# Patient Record
Sex: Male | Born: 1945 | Race: White | Hispanic: No | Marital: Single | Smoking: Current every day smoker
Health system: Southern US, Community
[De-identification: ages and names within clinical notes are randomized; demographics above are authoritative.]

## PROBLEM LIST (undated history)

## (undated) DIAGNOSIS — I10 Essential (primary) hypertension: Secondary | ICD-10-CM

## (undated) DIAGNOSIS — Z972 Presence of dental prosthetic device (complete) (partial): Secondary | ICD-10-CM

## (undated) DIAGNOSIS — G459 Transient cerebral ischemic attack, unspecified: Secondary | ICD-10-CM

## (undated) DIAGNOSIS — T753XXA Motion sickness, initial encounter: Secondary | ICD-10-CM

## (undated) DIAGNOSIS — F0390 Unspecified dementia without behavioral disturbance: Secondary | ICD-10-CM

## (undated) DIAGNOSIS — M1990 Unspecified osteoarthritis, unspecified site: Secondary | ICD-10-CM

## (undated) DIAGNOSIS — IMO0001 Reserved for inherently not codable concepts without codable children: Secondary | ICD-10-CM

## (undated) DIAGNOSIS — I639 Cerebral infarction, unspecified: Secondary | ICD-10-CM

## (undated) DIAGNOSIS — R29898 Other symptoms and signs involving the musculoskeletal system: Secondary | ICD-10-CM

## (undated) DIAGNOSIS — R569 Unspecified convulsions: Secondary | ICD-10-CM

## (undated) HISTORY — PX: FOOT SURGERY: SHX648

## (undated) HISTORY — PX: TONSILLECTOMY: SUR1361

---

## 2010-05-27 ENCOUNTER — Emergency Department: Payer: Self-pay | Admitting: Internal Medicine

## 2010-10-31 ENCOUNTER — Observation Stay: Payer: Self-pay | Admitting: *Deleted

## 2013-09-12 ENCOUNTER — Ambulatory Visit: Payer: Self-pay

## 2014-11-12 DIAGNOSIS — E7800 Pure hypercholesterolemia, unspecified: Secondary | ICD-10-CM | POA: Insufficient documentation

## 2014-11-12 DIAGNOSIS — G40909 Epilepsy, unspecified, not intractable, without status epilepticus: Secondary | ICD-10-CM | POA: Insufficient documentation

## 2014-12-29 ENCOUNTER — Ambulatory Visit: Payer: Self-pay | Admitting: Family Medicine

## 2015-12-08 ENCOUNTER — Emergency Department: Payer: Medicare Other

## 2015-12-08 ENCOUNTER — Encounter: Payer: Self-pay | Admitting: Emergency Medicine

## 2015-12-08 ENCOUNTER — Inpatient Hospital Stay
Admission: EM | Admit: 2015-12-08 | Discharge: 2015-12-12 | DRG: 389 | Disposition: A | Discharge status: Skilled Nursing Facility | Payer: Medicare Other | Attending: Surgery | Admitting: Surgery

## 2015-12-08 DIAGNOSIS — N179 Acute kidney failure, unspecified: Secondary | ICD-10-CM | POA: Diagnosis present

## 2015-12-08 DIAGNOSIS — R1310 Dysphagia, unspecified: Secondary | ICD-10-CM | POA: Diagnosis present

## 2015-12-08 DIAGNOSIS — Z87891 Personal history of nicotine dependence: Secondary | ICD-10-CM | POA: Diagnosis not present

## 2015-12-08 DIAGNOSIS — E871 Hypo-osmolality and hyponatremia: Secondary | ICD-10-CM | POA: Diagnosis present

## 2015-12-08 DIAGNOSIS — Z7902 Long term (current) use of antithrombotics/antiplatelets: Secondary | ICD-10-CM

## 2015-12-08 DIAGNOSIS — C9000 Multiple myeloma not having achieved remission: Secondary | ICD-10-CM

## 2015-12-08 DIAGNOSIS — R112 Nausea with vomiting, unspecified: Secondary | ICD-10-CM | POA: Diagnosis present

## 2015-12-08 DIAGNOSIS — E861 Hypovolemia: Secondary | ICD-10-CM | POA: Diagnosis present

## 2015-12-08 DIAGNOSIS — K5669 Other intestinal obstruction: Secondary | ICD-10-CM | POA: Diagnosis not present

## 2015-12-08 DIAGNOSIS — I69391 Dysphagia following cerebral infarction: Secondary | ICD-10-CM

## 2015-12-08 DIAGNOSIS — R0902 Hypoxemia: Secondary | ICD-10-CM

## 2015-12-08 DIAGNOSIS — K209 Esophagitis, unspecified: Secondary | ICD-10-CM | POA: Diagnosis present

## 2015-12-08 DIAGNOSIS — E876 Hypokalemia: Secondary | ICD-10-CM | POA: Diagnosis present

## 2015-12-08 DIAGNOSIS — M810 Age-related osteoporosis without current pathological fracture: Secondary | ICD-10-CM | POA: Diagnosis present

## 2015-12-08 DIAGNOSIS — I10 Essential (primary) hypertension: Secondary | ICD-10-CM | POA: Diagnosis not present

## 2015-12-08 DIAGNOSIS — R591 Generalized enlarged lymph nodes: Secondary | ICD-10-CM | POA: Diagnosis present

## 2015-12-08 DIAGNOSIS — Z79899 Other long term (current) drug therapy: Secondary | ICD-10-CM

## 2015-12-08 DIAGNOSIS — D3500 Benign neoplasm of unspecified adrenal gland: Secondary | ICD-10-CM | POA: Diagnosis present

## 2015-12-08 DIAGNOSIS — R05 Cough: Secondary | ICD-10-CM | POA: Diagnosis present

## 2015-12-08 DIAGNOSIS — E860 Dehydration: Secondary | ICD-10-CM | POA: Diagnosis present

## 2015-12-08 DIAGNOSIS — N390 Urinary tract infection, site not specified: Secondary | ICD-10-CM | POA: Diagnosis present

## 2015-12-08 DIAGNOSIS — I69354 Hemiplegia and hemiparesis following cerebral infarction affecting left non-dominant side: Secondary | ICD-10-CM | POA: Diagnosis not present

## 2015-12-08 DIAGNOSIS — Z79891 Long term (current) use of opiate analgesic: Secondary | ICD-10-CM

## 2015-12-08 DIAGNOSIS — K56609 Unspecified intestinal obstruction, unspecified as to partial versus complete obstruction: Secondary | ICD-10-CM

## 2015-12-08 DIAGNOSIS — F17210 Nicotine dependence, cigarettes, uncomplicated: Secondary | ICD-10-CM | POA: Diagnosis present

## 2015-12-08 DIAGNOSIS — G40909 Epilepsy, unspecified, not intractable, without status epilepticus: Secondary | ICD-10-CM | POA: Diagnosis present

## 2015-12-08 DIAGNOSIS — R319 Hematuria, unspecified: Secondary | ICD-10-CM | POA: Diagnosis present

## 2015-12-08 DIAGNOSIS — K5660 Unspecified intestinal obstruction: Secondary | ICD-10-CM | POA: Diagnosis present

## 2015-12-08 DIAGNOSIS — Z23 Encounter for immunization: Secondary | ICD-10-CM

## 2015-12-08 DIAGNOSIS — M899 Disorder of bone, unspecified: Secondary | ICD-10-CM | POA: Diagnosis not present

## 2015-12-08 DIAGNOSIS — I700 Atherosclerosis of aorta: Secondary | ICD-10-CM | POA: Diagnosis present

## 2015-12-08 HISTORY — DX: Transient cerebral ischemic attack, unspecified: G45.9

## 2015-12-08 HISTORY — DX: Cerebral infarction, unspecified: I63.9

## 2015-12-08 HISTORY — DX: Unspecified convulsions: R56.9

## 2015-12-08 HISTORY — DX: Essential (primary) hypertension: I10

## 2015-12-08 LAB — COMPREHENSIVE METABOLIC PANEL
ALK PHOS: 130 U/L — AB (ref 38–126)
ALT: 13 U/L — ABNORMAL LOW (ref 17–63)
ANION GAP: 8 (ref 5–15)
AST: 21 U/L (ref 15–41)
Albumin: 3.4 g/dL — ABNORMAL LOW (ref 3.5–5.0)
BUN: 20 mg/dL (ref 6–20)
CHLORIDE: 90 mmol/L — AB (ref 101–111)
CO2: 31 mmol/L (ref 22–32)
Calcium: 8.5 mg/dL — ABNORMAL LOW (ref 8.9–10.3)
Creatinine, Ser: 1.41 mg/dL — ABNORMAL HIGH (ref 0.61–1.24)
GFR calc Af Amer: 56 mL/min — ABNORMAL LOW (ref >60)
GFR calc non Af Amer: 48 mL/min — ABNORMAL LOW (ref >60)
Glucose, Bld: 130 mg/dL — ABNORMAL HIGH (ref 65–99)
Potassium: 3.8 mmol/L (ref 3.5–5.1)
SODIUM: 129 mmol/L — AB (ref 135–145)
Total Bilirubin: 0.5 mg/dL (ref 0.3–1.2)
Total Protein: 6.5 g/dL (ref 6.5–8.1)

## 2015-12-08 LAB — CBC WITH DIFFERENTIAL/PLATELET
BASOS PCT: 0 %
Basophils Absolute: 0 10*3/uL (ref 0–0.1)
EOS PCT: 0 %
Eosinophils Absolute: 0 10*3/uL (ref 0–0.7)
HCT: 38.7 % — ABNORMAL LOW (ref 40.0–52.0)
HEMOGLOBIN: 13.1 g/dL (ref 13.0–18.0)
Lymphocytes Relative: 2 %
Lymphs Abs: 0.3 10*3/uL — ABNORMAL LOW (ref 1.0–3.6)
MCH: 32.4 pg (ref 26.0–34.0)
MCHC: 34 g/dL (ref 32.0–36.0)
MCV: 95.3 fL (ref 80.0–100.0)
MONO ABS: 0.8 10*3/uL (ref 0.2–1.0)
MONOS PCT: 6 %
Neutro Abs: 11.9 10*3/uL — ABNORMAL HIGH (ref 1.4–6.5)
Neutrophils Relative %: 92 %
PLATELETS: 326 10*3/uL (ref 150–440)
RBC: 4.06 MIL/uL — ABNORMAL LOW (ref 4.40–5.90)
RDW: 14.7 % — ABNORMAL HIGH (ref 11.5–14.5)
WBC: 13 10*3/uL — ABNORMAL HIGH (ref 3.8–10.6)

## 2015-12-08 LAB — LIPASE, BLOOD: Lipase: 16 U/L (ref 11–51)

## 2015-12-08 MED ORDER — IOHEXOL 240 MG/ML SOLN
25.0000 mL | Freq: Once | INTRAMUSCULAR | Status: AC | PRN
  Administered 2015-12-08: 25 mL via ORAL

## 2015-12-08 MED ORDER — CARBAMAZEPINE 200 MG PO TABS
400.0000 mg | ORAL_TABLET | Freq: Two times a day (BID) | ORAL | Status: DC
  Administered 2015-12-10 – 2015-12-12 (×5): 400 mg via ORAL
  Filled 2015-12-08 (×8): qty 2

## 2015-12-08 MED ORDER — ONDANSETRON HCL 4 MG/2ML IJ SOLN
4.0000 mg | Freq: Four times a day (QID) | INTRAMUSCULAR | Status: DC | PRN
Start: 2015-12-08 — End: 2015-12-12
  Administered 2015-12-08: 4 mg via INTRAVENOUS
  Filled 2015-12-08: qty 2

## 2015-12-08 MED ORDER — ONDANSETRON HCL 4 MG/2ML IJ SOLN
4.0000 mg | Freq: Once | INTRAMUSCULAR | Status: AC | PRN
  Administered 2015-12-08: 4 mg via INTRAVENOUS

## 2015-12-08 MED ORDER — NICOTINE 14 MG/24HR TD PT24
MEDICATED_PATCH | TRANSDERMAL | Status: AC
  Administered 2015-12-08: 14 mg via TRANSDERMAL
  Filled 2015-12-08: qty 1

## 2015-12-08 MED ORDER — ONDANSETRON HCL 4 MG/2ML IJ SOLN
4.0000 mg | Freq: Once | INTRAMUSCULAR | Status: AC
  Administered 2015-12-08: 4 mg via INTRAVENOUS
  Filled 2015-12-08: qty 2

## 2015-12-08 MED ORDER — IOHEXOL 300 MG/ML  SOLN
80.0000 mL | Freq: Once | INTRAMUSCULAR | Status: AC | PRN
  Administered 2015-12-08: 80 mL via INTRAVENOUS

## 2015-12-08 MED ORDER — NICOTINE 14 MG/24HR TD PT24
14.0000 mg | MEDICATED_PATCH | Freq: Every day | TRANSDERMAL | Status: DC
  Administered 2015-12-08 – 2015-12-12 (×5): 14 mg via TRANSDERMAL
  Filled 2015-12-08 (×4): qty 1

## 2015-12-08 MED ORDER — PHENYTOIN 50 MG PO CHEW
50.0000 mg | CHEWABLE_TABLET | Freq: Two times a day (BID) | ORAL | Status: DC
  Filled 2015-12-08: qty 1

## 2015-12-08 MED ORDER — ONDANSETRON HCL 4 MG/2ML IJ SOLN
INTRAMUSCULAR | Status: AC
  Administered 2015-12-08: 4 mg via INTRAVENOUS
  Filled 2015-12-08: qty 2

## 2015-12-08 MED ORDER — PHENYTOIN SODIUM EXTENDED 100 MG PO CAPS: 200.0000 mg | ORAL_CAPSULE | Freq: Every day | ORAL | Status: DC

## 2015-12-08 MED ORDER — SODIUM CHLORIDE 0.9 % IV BOLUS (SEPSIS)
1000.0000 mL | Freq: Once | INTRAVENOUS | Status: AC
  Administered 2015-12-08: 1000 mL via INTRAVENOUS

## 2015-12-08 MED ORDER — PANTOPRAZOLE SODIUM 40 MG IV SOLR: 40.0000 mg | Freq: Every day | INTRAVENOUS | Status: DC

## 2015-12-08 MED ORDER — SIMVASTATIN 40 MG PO TABS
40.0000 mg | ORAL_TABLET | Freq: Every day | ORAL | Status: DC
  Administered 2015-12-10 – 2015-12-11 (×2): 40 mg via ORAL
  Filled 2015-12-08 (×2): qty 1

## 2015-12-08 MED ORDER — DEXTROSE IN LACTATED RINGERS 5 % IV SOLN
INTRAVENOUS | Status: DC
  Administered 2015-12-08 – 2015-12-10 (×5): via INTRAVENOUS
  Filled 2015-12-08 (×3): qty 1000

## 2015-12-08 MED ORDER — ONDANSETRON 4 MG PO TBDP: 4.0000 mg | ORAL_TABLET | Freq: Four times a day (QID) | ORAL | Status: DC | PRN

## 2015-12-08 MED ORDER — PHENYTOIN SODIUM 50 MG/ML IJ SOLN
100.0000 mg | Freq: Four times a day (QID) | INTRAMUSCULAR | Status: DC
  Administered 2015-12-08 – 2015-12-10 (×7): 100 mg via INTRAVENOUS
  Filled 2015-12-08 (×13): qty 2

## 2015-12-08 MED ORDER — PANTOPRAZOLE SODIUM 40 MG IV SOLR
40.0000 mg | Freq: Two times a day (BID) | INTRAVENOUS | Status: DC
  Administered 2015-12-08 – 2015-12-12 (×8): 40 mg via INTRAVENOUS
  Filled 2015-12-08 (×8): qty 40

## 2015-12-08 MED ORDER — MORPHINE SULFATE (PF) 2 MG/ML IV SOLN
2.0000 mg | INTRAVENOUS | Status: DC | PRN
  Administered 2015-12-08 – 2015-12-09 (×3): 2 mg via INTRAVENOUS
  Filled 2015-12-08 (×3): qty 1

## 2015-12-08 MED ORDER — HEPARIN SODIUM (PORCINE) 5000 UNIT/ML IJ SOLN
5000.0000 [IU] | Freq: Three times a day (TID) | INTRAMUSCULAR | Status: DC
  Administered 2015-12-08 – 2015-12-12 (×10): 5000 [IU] via SUBCUTANEOUS
  Filled 2015-12-08 (×10): qty 1

## 2015-12-08 NOTE — ED Notes (Signed)
Pt and patient's daughter state that patient started having nausea with vomiting that started last night around 10pm.  Daughter states that patient's vomit was black/tarry and his stool has been [employer].  Pt also has been "dragging" his left leg and complains of weakness, per daughter.  Pt has history of stroke x2, and is also complaining of headache.  Of note, patient has a cough with coarse rhonchi/gurgles, but daughter states she has not seen the actual sputum.

## 2015-12-08 NOTE — Consult Note (Signed)
[facility] [place] NAME: Elijah Evans    MR#:  [id number]  DATE OF BIRTH:  [birth year]  DATE OF ADMISSION:  12/08/2015  PRIMARY CARE PHYSICIAN: Nonlocal  REQUESTING/REFERRING PHYSICIAN: Dr. Susa Griffins  CHIEF COMPLAINT:   Chief Complaint  Patient presents with  . Emesis  . Weakness  . Headache    HISTORY OF PRESENT ILLNESS:  Elijah Evans  is a 70 y.o. male with a known history of stroke 5 years ago with minimal left-sided weakness, hypertension, seizure disorder presents to the hospital from home secondary to worsening nausea and vomiting and abdominal distention that started last night. Patient is a poor historian, his daughter who accompanied him is currently not at bedside. According to the patient he was in his usual state of health and all of a sudden last night he had to wake up because of significant nausea and then he started throwing up. Denies any blood in the vomitus. But initially it was food and then started to become more dark colored vomitus. Denies any diarrhea. Had a normal bowel movement last night. No blood per rectum noted. He feels like his abdomen is swollen and has generalized abdominal pain. No prior abdominal surgeries noted. CT of the abdomen here shows complete small bowel obstruction. There are also some nonspecific lymph nodes noted in the abdomen and osteoporosis and bony changes that could represent maybe myeloma. Medicine consult requested for medical management.  PAST MEDICAL HISTORY:   Past Medical History  Diagnosis Date  . Stroke ([place])   . TIA (transient ischemic attack)   . Hypertension   . Seizures ([place])     PAST SURGICAL HISTOIRY:   Past Surgical History  Procedure Laterality Date  . Foot surgery    . Tonsillectomy      SOCIAL HISTORY:   Social History  Substance Use Topics  . Smoking status: Current Every Day Smoker -- 1.00 packs/day    Types: Cigarettes  . Smokeless  tobacco: Not on file  . Alcohol Use: No     Comment: Used to drink a lot but quit 5 years ago.    FAMILY HISTORY:   Family History  Problem Relation Age of Onset  . CAD Mother   . CAD Father     DRUG ALLERGIES:  No Known Allergies  REVIEW OF SYSTEMS:   Review of Systems  Constitutional: Negative for fever, chills, weight loss and malaise/fatigue.  HENT: Negative for ear discharge, ear pain, hearing loss and nosebleeds.   Eyes: Negative for blurred vision, double vision and photophobia.  Respiratory: Negative for cough, hemoptysis, shortness of breath and wheezing.   Cardiovascular: Negative for chest pain, palpitations, orthopnea and leg swelling.  Gastrointestinal: Positive for nausea, vomiting and abdominal pain. Negative for heartburn, diarrhea, constipation and melena.  Genitourinary: Negative for dysuria, urgency, frequency and hematuria.  Musculoskeletal: Negative for myalgias, back pain and neck pain.  Skin: Negative for rash.  Neurological: Negative for dizziness, tremors, sensory change, speech change, focal weakness and headaches.  Endo/Heme/Allergies: Does not bruise/bleed easily.  Psychiatric/Behavioral: Negative for depression.    MEDICATIONS AT HOME:   Prior to Admission medications   Medication Sig Start Date End Date Taking? Authorizing Provider  carbamazepine (TEGRETOL) 200 MG tablet Take 400 mg by mouth 2 (two) times daily.   Yes Historical Provider, MD  dipyridamole-aspirin (AGGRENOX) 200-25 MG 12hr capsule Take 1 capsule by mouth 2 (two) times daily.   Yes Historical Provider, MD  losartan-hydrochlorothiazide (HYZAAR) 100-25 MG tablet Take 1 tablet by mouth daily.   Yes Historical Provider, MD  phenytoin (DILANTIN) 100 MG ER capsule Take 200 mg by mouth at bedtime.   Yes Historical Provider, MD  phenytoin (DILANTIN) 30 MG ER capsule Take 60 mg by mouth 2 (two) times daily.   Yes Historical Provider, MD  simvastatin (ZOCOR) 40 MG tablet Take 40 mg by mouth  at bedtime.   Yes Historical Provider, MD      VITAL SIGNS:  Blood pressure 119/71, pulse 99, temperature 98.7 F (37.1 C), temperature source Oral, resp. rate 16, height 6' (1.829 m), weight 83.915 kg (185 lb), SpO2 96 %.  PHYSICAL EXAMINATION:   Physical Exam  GENERAL:  70 y.o.-year-old patient lying in the bed with no acute distress.  EYES: Pupils are unequal, right pupil is slightly enlarged than the left one but both are round, reactive to light and accommodation. No scleral icterus. Extraocular muscles intact.  HEENT: Head atraumatic, normocephalic. Oropharynx and nasopharynx clear. Mild left facial droop is noted. NECK:  Supple, no jugular venous distention. No thyroid enlargement, no tenderness.  LUNGS: Normal breath sounds bilaterally, no wheezing, rales,rhonchi or crepitation. No use of accessory muscles of respiration. Decreased bibasilar breath sounds. Some gurgling congestion noted mostly in the upper airway. CARDIOVASCULAR: S1, S2 normal. No  rubs, or gallops. 3/6 systolic murmur is present ABDOMEN: Soft, nontender, nondistended. Bowel sounds present. No organomegaly or mass.  EXTREMITIES: No pedal edema, cyanosis, or clubbing.  NEUROLOGIC: Cranial nerves II through XII are intact. Muscle strength 5/5 in all extremities. Sensation intact. Gait not checked. Mild left facial droop noted. PSYCHIATRIC: The patient is alert and oriented x 3.  SKIN: No obvious rash, lesion, or ulcer.   LABORATORY PANEL:   CBC  Recent Labs Lab 12/08/15 1311  WBC 13.0*  HGB 13.1  HCT 38.7*  PLT 326   ------------------------------------------------------------------------------------------------------------------  Chemistries   Recent Labs Lab 12/08/15 1311  NA 129*  K 3.8  CL 90*  CO2 31  GLUCOSE 130*  BUN 20  CREATININE 1.41*  CALCIUM 8.5*  AST 21  ALT 13*  ALKPHOS 130*  BILITOT 0.5    ------------------------------------------------------------------------------------------------------------------  Cardiac Enzymes No results for input(s): TROPONINI in the last 168 hours. ------------------------------------------------------------------------------------------------------------------  RADIOLOGY:  Dg Chest 2 View  12/08/2015  CLINICAL DATA:  Nausea, vomiting beginning last night around 10 p.m. Cough. EXAM: CHEST  2 VIEW COMPARISON:  09/12/2013 FINDINGS: Calcified granulomas in the right mid and lower lung. No confluent airspace opacities or effusions. No acute bony abnormality. Heart is normal size. No effusions. IMPRESSION: No active cardiopulmonary disease.  Old granulomatous disease. Electronically Signed   By: Rolm Baptise M.D.   On: 12/08/2015 13:34   Ct Head Wo Contrast  12/08/2015  CLINICAL DATA:  Nausea and vomiting starting last night. EXAM: CT HEAD WITHOUT CONTRAST TECHNIQUE: Contiguous axial images were obtained from the base of the skull through the vertex without contrast. COMPARISON:  10/31/2010 FINDINGS: There is mild cerebral atrophy. Mild irregularity along the top of the lateral ventricles is similar to the prior examination. Again noted are scattered areas of low-density throughout the white matter suggesting chronic changes. No evidence for acute hemorrhage, mass lesion, midline shift, hydrocephalus or large infarct. Visualized paranasal sinuses are clear. No acute bone abnormality. IMPRESSION: No acute intracranial abnormality. Chronic scattered white matter changes. Findings are similar to the previous examination. Electronically Signed   By: Markus Daft M.D.   On: 12/08/2015 17:40  Ct Abdomen Pelvis W Contrast  12/08/2015  CLINICAL DATA:  Nausea and vomiting since last evening. EXAM: CT ABDOMEN AND PELVIS WITH CONTRAST TECHNIQUE: Multidetector CT imaging of the abdomen and pelvis was performed using the standard protocol following bolus administration of  intravenous contrast. CONTRAST:  80 cc Omnipaque 350 COMPARISON:  None. FINDINGS: Lower chest: The lung bases are clear acute process. Mild emphysematous changes are noted. No pleural effusion or pulmonary lesions. Moderate pectus deformity. The heart is normal in size. Coronary artery calcifications are noted. There is moderate distal esophageal wall thickening. Findings could be due to reflux esophagitis and/or vomiting by could not exclude an infiltrating mass. Recommend correlation with bronchoscopy or contrast esophagram. Hepatobiliary: No focal hepatic lesions or intrahepatic biliary dilatation. The gallbladder is collapsed. There appears to be a small amount of air within it. Mild common bile duct dilatation measuring 8 mm. Suspect a small distal common bile duct calculus. Pancreas: No mass, inflammation or ductal dilatation. Spleen: Normal size.  No focal lesions. Adrenals/Urinary Tract: Enlarged left adrenal gland with nodularity. Probable adenomas but will need followup. The right adrenal gland is normal. Bilateral renal cysts. No worrisome renal lesions. No hydronephrosis. Stomach/Bowel: The stomach is distended with fluid. The duodenum and small bowel are dilated and fluid-filled with air-fluid levels consistent with obstruction. There is a transition from dilated to nondilated small bowel in the upper pelvis anteriorly best seen on axial image number 60 and coronal image 49. No masses identified. There is probably an adhesion in this area. The colon is largely decompressed. Some stool noted in the sigmoid colon and rectum. Vascular/Lymphatic: Advanced atherosclerotic calcifications involving the aorta and branch vessels but no focal aneurysm or dissection. There are small scattered mesenteric and retroperitoneal lymph nodes but no mass or overt adenopathy. Other: The bladder, prostate gland and seminal vesicles are unremarkable. No pelvic mass or adenopathy. There is a small amount of free pelvic fluid.  No inguinal adenopathy. Small left inguinal hernia containing fat. Musculoskeletal: Very mottled appearance of the bony structures. This could be progressive osteoporosis. Could not exclude metastatic disease or myeloma. Recommend correlation with clinical findings. IMPRESSION: 1. CT findings consistent with a small bowel obstruction likely due to adhesions in the anterior upper pelvis as described above. 2. Diffuse distal esophageal wall thickening could be due to esophagitis or related to vomiting. Endoscopy or contrast esophagram may be helpful for further evaluation. 3. Nodular enlargement of the left adrenal gland, this is likely due to benign adenomas but I would recommend a followup noncontrast abdominal CT scan and 4 months to document stability. 4. Advanced atherosclerotic calcifications involving the aorta and branch vessels but no aneurysm. 5. Borderline enlarged lymph nodes in the root of the small bowel mesenteric and a few in the retroperitoneum likely due to the small bowel obstruction. 6. Contracted gallbladder containing a small amount of air. There may be a small calculus in the distal common bile duct also. Followup ERCP or MRCP may be helpful for further evaluation. Recommend correlation with liver function studies. 7. Very mottled appearance of the bony structures. This could be aggressive osteoporosis but could not exclude diffuse marrow process such as metastatic disease or myeloma. Electronically Signed   By: Marijo Sanes M.D.   On: 12/08/2015 17:49    EKG:   Orders placed or performed during the hospital encounter of 12/08/15  . ED EKG  . ED EKG    IMPRESSION AND PLAN:   Elijah Evans  is a 70 y.o.  male with a known history of stroke 5 years ago with minimal left-sided weakness, hypertension, seizure disorder presents to the hospital from home secondary to worsening nausea and vomiting and abdominal distention that started last night.  #1 small bowel obstruction-management  per surgical team. Patient is nothing by mouth -Significantly nauseous, would benefit from NG tube at this time. -Keep him nothing by mouth for possible surgery. -No cardiac history, stroke was 5 years ago. Moderate risk for surgery at this time.  #2 lymphadenopathy noted in the abdomen-nonspecific lymphadenopathy and also bone marrow changes noted on CT abdomen-unexplainable small bowel obstruction on CT. -Oncology consulted  #3 history of CVA-hold Aggrenox in case he needs surgery. Continue statin for now  #4 hypertension-hold his blood pressure medication for now.  #5 hyponatremia-likely from dehydration, hypovolemia. He started on D5 lactated ringer right the surgeons-monitor sodium and adjust fluids.  #6 seizure disorder-patient on phenytoin and also Tegretol at home. We'll continue those.  #7 esophagitis noted on CT-likely from recurrent episodes of vomiting since last night. -Continue Protonix twice a day for now  #8 tobacco use disorder-started on nicotine patch here  #9 DVT prophylaxis-on subcutaneous heparin   All the records are reviewed and case discussed with Consulting provider. Management plans discussed with the patient, family and they are in agreement.  CODE STATUS: Full Code  TOTAL TIME TAKING CARE OF THIS PATIENT: 50 minutes.    Gladstone Lighter M.D on 12/08/2015 at 8:09 PM  Between 7am to 6pm - Pager - [phone]  After 6pm go to [email or web address] - password [email or web address] [facility]  Office  [phone]  CC: Primary care Physician: No primary care provider on file.

## 2015-12-08 NOTE — ED Provider Notes (Signed)
[facility] Emergency Department Provider Note  ____________________________________________  Time seen: Approximately 3:43 PM  I have reviewed the triage vital signs and the nursing notes.   HISTORY  Chief Complaint Emesis; Weakness; and Headache    HPI Elijah Evans is a 70 y.o. male reports that he's been throwing up since yesterday. Patient reports that he's been vomiting, stomach seems swollen. He also reports this morning when he got up from bed he felt dizzy and off balance with difficulty.  He denies any changes in his speech. He reports he is chronically weak on left side due to previous stroke. This is not changed. Patient does have a cough, reports his stomach is swollen.  He denies being in pain or having any nausea.  Denies diarrhea.  Past Medical History  Diagnosis Date  . Stroke ([place])   . TIA (transient ischemic attack)   . Hypertension   . Seizures [facility])     Patient Active Problem List   Diagnosis Date Noted  . Small bowel obstruction ([place]) 12/08/2015    Past Surgical History  Procedure Laterality Date  . Foot surgery      Current Outpatient Rx  Name  Route  Sig  Dispense  Refill  . carbamazepine (TEGRETOL) 200 MG tablet   Oral   Take 400 mg by mouth 2 (two) times daily.         Marland Kitchen dipyridamole-aspirin (AGGRENOX) 200-25 MG 12hr capsule   Oral   Take 1 capsule by mouth 2 (two) times daily.         Marland Kitchen losartan-hydrochlorothiazide (HYZAAR) 100-25 MG tablet   Oral   Take 1 tablet by mouth daily.         . phenytoin (DILANTIN) 100 MG ER capsule   Oral   Take 200 mg by mouth at bedtime.         . phenytoin (DILANTIN) 30 MG ER capsule   Oral   Take 60 mg by mouth 2 (two) times daily.         . simvastatin (ZOCOR) 40 MG tablet   Oral   Take 40 mg by mouth at bedtime.           Allergies Review of patient's allergies indicates no known allergies.  History reviewed. No pertinent family  history.  Social History Social History  Substance Use Topics  . Smoking status: Current Every Day Smoker -- 1.00 packs/day    Types: Cigarettes  . Smokeless tobacco: None  . Alcohol Use: No    Review of Systems Constitutional: No fever/chills Eyes: No visual changes. ENT: No sore throat. Cardiovascular: Denies chest pain. Respiratory: Denies shortness of breath. Positive for cough. Productive at times. Gastrointestinal: No abdominal pain.  No nausea, no vomiting.  Constipated. Abdomen does feel swollen. Genitourinary: Negative for dysuria. Musculoskeletal: Negative for back pain. Skin: Negative for rash. Neurological: Negative for headaches, focal weakness except for chronic weakness in the left side without change or numbness.  10-point ROS otherwise negative.  ____________________________________________   PHYSICAL EXAM:  VITAL SIGNS: ED Triage Vitals  Enc Vitals Group     BP 12/08/15 1235 106/64 mmHg     Pulse Rate 12/08/15 1235 80     Resp 12/08/15 1235 20     Temp 12/08/15 1235 98.5 F (36.9 C)     Temp Source 12/08/15 1235 Oral     SpO2 12/08/15 1235 94 %     Weight 12/08/15 1235 185 lb (83.915 kg)  Height 12/08/15 1235 6' (1.829 m)     Head Cir --      Peak Flow --      Pain Score --      Pain Loc --      Pain Edu? --      Excl. in [place]? --    Constitutional: Alert and oriented. Generally fatigued appearing but in no distress. He does have frequent cough and is spitting up somewhat bilious appearing fluid at times. He is able to swallow. Eyes: Conjunctivae are normal. PERRL. EOMI. Head: Atraumatic. Nose: No congestion/rhinnorhea. Mouth/Throat: Mucous membranes are very dry.  Oropharynx non-erythematous. Neck: No stridor.   Cardiovascular: Normal rate, regular rhythm. Grossly normal heart sounds.  Good peripheral circulation. Respiratory: Normal respiratory effort.  No retractions. Lungs CTAB except for a slightly rhonchorous cough. Gastrointestinal:  Protuberant and somewhat temp and neck but nontender. No abdominal bruits. Musculoskeletal: No lower extremity tenderness nor edema.  No joint effusions. Neurologic:  Normal speech and language. Patient has mild left upper and left lower extremity weakness which she reports is chronic and unchanged. He demonstrates improved strength on the right arm and right leg. No facial droop.  Skin:  Skin is warm, dry and intact. No rash noted. Psychiatric: Mood and affect are somewhat flat and withdrawn. ____________________________________________   LABS (all labs ordered are listed, but only abnormal results are displayed)  Labs Reviewed  CBC WITH DIFFERENTIAL/PLATELET - Abnormal; Notable for the following:    WBC 13.0 (*)    RBC 4.06 (*)    HCT 38.7 (*)    RDW 14.7 (*)    Neutro Abs 11.9 (*)    Lymphs Abs 0.3 (*)    All other components within normal limits  COMPREHENSIVE METABOLIC PANEL - Abnormal; Notable for the following:    Sodium 129 (*)    Chloride 90 (*)    Glucose, Bld 130 (*)    Creatinine, Ser 1.41 (*)    Calcium 8.5 (*)    Albumin 3.4 (*)    ALT 13 (*)    Alkaline Phosphatase 130 (*)    GFR calc non Af Amer 48 (*)    GFR calc Af Amer 56 (*)    All other components within normal limits  LIPASE, BLOOD  URINALYSIS COMPLETEWITH MICROSCOPIC ([facility] ONLY)  CBC  CREATININE, SERUM  BASIC METABOLIC PANEL  MAGNESIUM  PHOSPHORUS  CBC   ____________________________________________  EKG  Reviewed and interpreted by me at 1600 Ventricular 94 QRS 122 QTc 460 Some mild artifact is present, a right bundle branch block is noted without clear ischemic abnormality. T wave inversions associated with atypical right bundle branch block are noted. No evidence of ST elevation. ____________________________________________  [id number]  DG Chest 2 View (Final result) Result time: 12/08/15 13:34:35   Final result by Rad Results In Interface (12/08/15 13:34:35)   Narrative:   CLINICAL  DATA: Nausea, vomiting beginning last night around 10 p.m. Cough.  EXAM: CHEST 2 VIEW  COMPARISON: 09/12/2013  FINDINGS: Calcified granulomas in the right mid and lower lung. No confluent airspace opacities or effusions. No acute bony abnormality. Heart is normal size. No effusions.  IMPRESSION: No active cardiopulmonary disease. Old granulomatous disease.   Electronically Signed By: Rolm Baptise M.D. On: 12/08/2015 13:34    IMPRESSION: 1. CT findings consistent with a small bowel obstruction likely due to adhesions in the anterior upper pelvis as described above. 2. Diffuse distal esophageal wall thickening could be due to esophagitis or related to vomiting.  Endoscopy or contrast esophagram may be helpful for further evaluation. 3. Nodular enlargement of the left adrenal gland, this is likely due to benign adenomas but I would recommend a followup noncontrast abdominal CT scan and 4 months to document stability. 4. Advanced atherosclerotic calcifications involving the aorta and branch vessels but no aneurysm. 5. Borderline enlarged lymph nodes in the root of the small bowel mesenteric and a few in the retroperitoneum likely due to the small bowel obstruction. 6. Contracted gallbladder containing a small amount of air. There may be a small calculus in the distal common bile duct also. Followup ERCP or MRCP may be helpful for further evaluation. Recommend correlation with liver function studies. 7. Very mottled appearance of the bony structures. This could be aggressive osteoporosis but could not exclude diffuse marrow process such as metastatic disease or myeloma.   Electronically Signed  By: Marijo Sanes M.D.  On: 12/08/2015 17:49  ____________________________________________   PROCEDURES  Procedure(s) performed: None  Critical Care performed: No  ____________________________________________   INITIAL IMPRESSION / ASSESSMENT AND PLAN / ED  COURSE  Pertinent labs & imaging results that were available during my care of the patient were reviewed by me and considered in my medical decision making (see chart for details).  ----------------------------------------- 6:06 PM on 12/08/2015 -----------------------------------------  CT clinical case discussed with general surgery Dr. Connye Burkitt. She notes multiple other findings on CT, the patient denies a history of previous surgery or known cancer. She will evaluate further with consultation in the ER. Anticipate admission thereafter. Clinically based on his symptoms exam and CT finding I am concerned about small bowel obstruction but the etiology of this is unclear. Anticipate admission for further workup. Patient awake alert in no distress. Not complaining of pain at present but continues to spit up occasional green liquids like bile. ____________________________________________   FINAL CLINICAL IMPRESSION(S) / ED DIAGNOSES  Final diagnoses:  Small bowel obstruction ([facility])      Delman Kitten, MD 12/08/15 1900

## 2015-12-08 NOTE — H&P (Signed)
Patient ID: Elijah Evans, male   DOB: Aug 24, 1946, 70 y.o.   MRN: [id number]  History of Present Illness Elijah Evans is a 70 y.o. male with a history of diffuse abdominal pain, nausea and vomiting. He had more room stool yesterday as well. Currently he denies any abdominal pain. Patient abdominal pain is intermittent moderate. Please note that the patient has some difficulty remembering things since he had 2 previous strokes and multiple TIAs. Most of the history is taken from the daughter that was present at the bedside and is the caregiver of the pt. It is difficult to really establish any particular symptoms. There was a questionable weight loss, he does not see doctors frequently. He does smoke 1 pack per day and per the daughter he has been having productive cough and sputum for the last 2 days or so. There has no be any evidence of previous abdominal surgery the only surgery he has had is an ankle surgery several years ago. As far as his activities of daily living he is mainly in his house he can walk for a few yards but his [place] has significant coordination issues he basically uses the furniture on his house to be able to walk.   Past Medical History Past Medical History  Diagnosis Date  . Stroke ([place])   . TIA (transient ischemic attack)   . Hypertension   . Seizures [facility])       Past Surgical History  Procedure Laterality Date  . Foot surgery      No Known Allergies  Current Facility-Administered Medications  Medication Dose Route Frequency Provider Last Rate Last Dose  . dextrose 5 % in lactated ringers infusion   Intravenous Continuous Diego F Pabon, MD      . heparin injection 5,000 Units  5,000 Units Subcutaneous 3 times per day Jules Husbands, MD      . morphine 2 MG/ML injection 2 mg  2 mg Intravenous Q2H PRN Diego F Pabon, MD      . nicotine (NICODERM CQ - dosed in mg/24 hours) patch 14 mg  14 mg Transdermal Daily Diego F Pabon, MD      . ondansetron  (ZOFRAN-ODT) disintegrating tablet 4 mg  4 mg Oral Q6H PRN Diego F Pabon, MD       Or  . ondansetron (ZOFRAN) injection 4 mg  4 mg Intravenous Q6H PRN Diego F Pabon, MD      . pantoprazole (PROTONIX) injection 40 mg  40 mg Intravenous QHS Diego F Pabon, MD      . phenytoin (DILANTIN) injection 100 mg  100 mg Intravenous Q6H Diego Sarita Haver, MD       Current Outpatient Prescriptions  Medication Sig Dispense Refill  . carbamazepine (TEGRETOL) 200 MG tablet Take 400 mg by mouth 2 (two) times daily.    Marland Kitchen dipyridamole-aspirin (AGGRENOX) 200-25 MG 12hr capsule Take 1 capsule by mouth 2 (two) times daily.    Marland Kitchen losartan-hydrochlorothiazide (HYZAAR) 100-25 MG tablet Take 1 tablet by mouth daily.    . phenytoin (DILANTIN) 100 MG ER capsule Take 200 mg by mouth at bedtime.    . phenytoin (DILANTIN) 30 MG ER capsule Take 60 mg by mouth 2 (two) times daily.    . simvastatin (ZOCOR) 40 MG tablet Take 40 mg by mouth at bedtime.      Family History History reviewed. No pertinent family history.   Social History Social History  Substance Use Topics  . Smoking status: Current Every  Day Smoker -- 1.00 packs/day    Types: Cigarettes  . Smokeless tobacco: None  . Alcohol Use: No       Review of Systems  All other systems reviewed and are negative.    Physical Exam Blood pressure 114/76, pulse 86, temperature 98.5 F (36.9 C), temperature source Oral, resp. rate 18, height 6' (1.829 m), weight 83.915 kg (185 lb), SpO2 98 %.  CONSTITUTIONAL: Elderly male EYES: Pupils equal, round, and reactive to light, Sclera non-icteric. EARS, NOSE, MOUTH AND THROAT: The oropharynx is clear. Oral mucosa is pink and moist. Hearing is intact to voice.  NECK: Trachea is midline, and there is no jugular venous distension. Thyroid is without palpable abnormalities. LYMPH NODES:  Lymph nodes in the neck are not enlarged. Left mobile lipoma RESPIRATORY:  Rales bilaterally, . Normal respiratory effort without pathologic  use of accessory muscles. CARDIOVASCULAR: Heart is regular without murmurs, gallops, or rubs. GI: The abdomen is  soft, nontender, it is distended. There were no palpable masses. There was no hepatosplenomegaly. No peritonitis MUSCULOSKELETAL:  Normal muscle strength and tone in all four extremities.    SKIN: Skin turgor is normal. There are no pathologic skin lesions.  NEUROLOGIC:  Motor and sensation is grossly normal.  Cranial nerves are grossly intact. PSYCH:  Alert and oriented to person, place and time. Affect is normal.  Data Reviewed CT scan personally reviewed there is evidence of a transition area in the mid jejunum with decompressed small bowel and proximal dilated bowel. There is significant mesentery adenopathy as well as retroperitoneal adenopathy. He he has thickening of the esophagus after the thoracic level where the CT ends. Specifically there's no small bowel masses or evidence of stellate lesions on the mesentery to suggest carcinoid . White count is slightly elevated, 13k. And he does have an acute kidney injury likely related to dehydration   I have personally reviewed the patient's imaging and medical records.    Assessment/  Plan   elderly male with multiple medical problems now admitted with small bowel obstruction. The etiology of the obstruction is not completely clear and he does have multiple issues and make his diagnosis more challenging. In the meantime we will go ahead and admit the patient place an NG tube resuscitated with crystalloids and asked the medicine doctors to evaluate him for any other medical issues that he has. I do think that he may benefit from a small bowel follow-through in the near future and serial abdominal exams. I do not believe that there is an immediate need for surgical revision at this time. Possible etiologies of the bowel obstruction R cancer IE carcinoid or lymphoma and was likely adhesion. Extensive counseling provided to the family and  to the patient and they seem to understand  Face-to-face time spent with the patient and care providers was 70 minutes, with more than 50% of the time spent counseling, educating, and coordinating care of the patient.    Caroleen Hamman, MD [place] 12/08/2015, 6:56 PM

## 2015-12-08 NOTE — ED Notes (Signed)
Patient transported to CT 

## 2015-12-09 ENCOUNTER — Inpatient Hospital Stay: Payer: Medicare Other

## 2015-12-09 DIAGNOSIS — Z8673 Personal history of transient ischemic attack (TIA), and cerebral infarction without residual deficits: Secondary | ICD-10-CM

## 2015-12-09 DIAGNOSIS — M899 Disorder of bone, unspecified: Secondary | ICD-10-CM

## 2015-12-09 DIAGNOSIS — I10 Essential (primary) hypertension: Secondary | ICD-10-CM

## 2015-12-09 DIAGNOSIS — Z87891 Personal history of nicotine dependence: Secondary | ICD-10-CM

## 2015-12-09 DIAGNOSIS — K5660 Unspecified intestinal obstruction: Principal | ICD-10-CM

## 2015-12-09 DIAGNOSIS — K56609 Unspecified intestinal obstruction, unspecified as to partial versus complete obstruction: Secondary | ICD-10-CM | POA: Insufficient documentation

## 2015-12-09 LAB — URINALYSIS COMPLETE WITH MICROSCOPIC (ARMC ONLY)
Bacteria, UA: NONE SEEN
Bilirubin Urine: NEGATIVE
Bilirubin Urine: NEGATIVE
GLUCOSE, UA: NEGATIVE mg/dL
Glucose, UA: NEGATIVE mg/dL
HGB URINE DIPSTICK: NEGATIVE
HGB URINE DIPSTICK: NEGATIVE
Ketones, ur: NEGATIVE mg/dL
LEUKOCYTES UA: NEGATIVE
LEUKOCYTES UA: NEGATIVE
NITRITE: NEGATIVE
Nitrite: NEGATIVE
PROTEIN: 30 mg/dL — AB
Protein, ur: NEGATIVE mg/dL
SPECIFIC GRAVITY, URINE: 1.042 — AB (ref 1.005–1.030)
SPECIFIC GRAVITY, URINE: 1.035 — AB (ref 1.005–1.030)
pH: 5 (ref 5.0–8.0)
pH: 5 (ref 5.0–8.0)

## 2015-12-09 LAB — CBC
HEMATOCRIT: 33.6 % — AB (ref 40.0–52.0)
Hemoglobin: 11.5 g/dL — ABNORMAL LOW (ref 13.0–18.0)
MCH: 32.6 pg (ref 26.0–34.0)
MCHC: 34.2 g/dL (ref 32.0–36.0)
MCV: 95.2 fL (ref 80.0–100.0)
Platelets: 262 10*3/uL (ref 150–440)
RBC: 3.53 MIL/uL — ABNORMAL LOW (ref 4.40–5.90)
RDW: 14.8 % — AB (ref 11.5–14.5)
WBC: 10.2 10*3/uL (ref 3.8–10.6)

## 2015-12-09 LAB — BASIC METABOLIC PANEL
Anion gap: 5 (ref 5–15)
BUN: 22 mg/dL — AB (ref 6–20)
CALCIUM: 7.9 mg/dL — AB (ref 8.9–10.3)
CO2: 29 mmol/L (ref 22–32)
CREATININE: 0.95 mg/dL (ref 0.61–1.24)
Chloride: 97 mmol/L — ABNORMAL LOW (ref 101–111)
GFR calc Af Amer: >60 mL/min (ref >60)
GLUCOSE: 133 mg/dL — AB (ref 65–99)
Potassium: 3.2 mmol/L — ABNORMAL LOW (ref 3.5–5.1)
Sodium: 131 mmol/L — ABNORMAL LOW (ref 135–145)

## 2015-12-09 LAB — MAGNESIUM: Magnesium: 1.7 mg/dL (ref 1.7–2.4)

## 2015-12-09 LAB — PHOSPHORUS: Phosphorus: 4 mg/dL (ref 2.5–4.6)

## 2015-12-09 MED ORDER — POTASSIUM CHLORIDE 10 MEQ/100ML IV SOLN
10.0000 meq | INTRAVENOUS | Status: AC
  Administered 2015-12-09 (×4): 10 meq via INTRAVENOUS
  Filled 2015-12-09 (×4): qty 100

## 2015-12-09 NOTE — Progress Notes (Signed)
Dr Dahlia Byes notified that patient pulled NG tube out and refuses to have another one.

## 2015-12-09 NOTE — Progress Notes (Addendum)
[facility] [place] NAME: Elijah Evans    MR#:  [id number]  DATE OF BIRTH:  07-27-1946  SUBJECTIVE:  CHIEF COMPLAINT:   Chief Complaint  Patient presents with  . Emesis  . Weakness  . Headache   No abd pain, nausea or vomiting, passed gas, but no BM. He was on NGT suction when I saw him, but he pulled off NTG later per RN. REVIEW OF SYSTEMS:  CONSTITUTIONAL: No fever, has weakness.  EYES: No blurred or double vision.  EARS, NOSE, AND THROAT: No tinnitus or ear pain.  RESPIRATORY: No cough, shortness of breath, wheezing or hemoptysis.  CARDIOVASCULAR: No chest pain, orthopnea, edema.  GASTROINTESTINAL: No nausea, vomiting, diarrhea or abdominal pain.  GENITOURINARY: No dysuria, hematuria.  ENDOCRINE: No polyuria, nocturia,  HEMATOLOGY: No anemia, easy bruising or bleeding SKIN: No rash or lesion. MUSCULOSKELETAL: No joint pain or arthritis.   NEUROLOGIC: No tingling, numbness, weakness.  PSYCHIATRY: No anxiety or depression.   DRUG ALLERGIES:  No Known Allergies  VITALS:  Blood pressure 107/52, pulse 89, temperature 98 F (36.7 C), temperature source Oral, resp. rate 18, height 6' (1.829 m), weight 75.07 kg (165 lb 8 oz), SpO2 97 %.  PHYSICAL EXAMINATION:  GENERAL:  70 y.o.-year-old patient lying in the bed with no acute distress.  EYES: Pupils equal, round, reactive to light and accommodation. No scleral icterus. Extraocular muscles intact.  HEENT: Head atraumatic, normocephalic. Moist oral mucosa. NECK:  Supple, no jugular venous distention. No thyroid enlargement, no tenderness.  LUNGS: Normal breath sounds bilaterally, no wheezing, rales,rhonchi or crepitation. No use of accessory muscles of respiration.  CARDIOVASCULAR: S1, S2 normal. No murmurs, rubs, or gallops.  ABDOMEN: Soft, nontender, nondistended. Bowel sounds present. No organomegaly or mass.  EXTREMITIES: No pedal edema, cyanosis, or clubbing.   NEUROLOGIC: Cranial nerves II through XII are intact. Muscle strength 4/5 in all extremities. Sensation intact. Gait not checked.  PSYCHIATRIC: The patient is alert and oriented x 3.  SKIN: No obvious rash, lesion, or ulcer.    LABORATORY PANEL:   CBC  Recent Labs Lab 12/09/15 0647  WBC 10.2  HGB 11.5*  HCT 33.6*  PLT 262   ------------------------------------------------------------------------------------------------------------------  Chemistries   Recent Labs Lab 12/08/15 1311 12/09/15 0647  NA 129* 131*  K 3.8 3.2*  CL 90* 97*  CO2 31 29  GLUCOSE 130* 133*  BUN 20 22*  CREATININE 1.41* 0.95  CALCIUM 8.5* 7.9*  MG  --  1.7  AST 21  --   ALT 13*  --   ALKPHOS 130*  --   BILITOT 0.5  --    ------------------------------------------------------------------------------------------------------------------  Cardiac Enzymes No results for input(s): TROPONINI in the last 168 hours. ------------------------------------------------------------------------------------------------------------------  RADIOLOGY:  Dg Chest 2 View  12/08/2015  CLINICAL DATA:  Nausea, vomiting beginning last night around 10 p.m. Cough. EXAM: CHEST  2 VIEW COMPARISON:  09/12/2013 FINDINGS: Calcified granulomas in the right mid and lower lung. No confluent airspace opacities or effusions. No acute bony abnormality. Heart is normal size. No effusions. IMPRESSION: No active cardiopulmonary disease.  Old granulomatous disease. Electronically Signed   By: Rolm Baptise M.D.   On: 12/08/2015 13:34   Ct Head Wo Contrast  12/08/2015  CLINICAL DATA:  Nausea and vomiting starting last night. EXAM: CT HEAD WITHOUT CONTRAST TECHNIQUE: Contiguous axial images were obtained from the base of the skull through the vertex without contrast. COMPARISON:  10/31/2010 FINDINGS: There is mild cerebral  atrophy. Mild irregularity along the top of the lateral ventricles is similar to the prior examination. Again noted are  scattered areas of low-density throughout the white matter suggesting chronic changes. No evidence for acute hemorrhage, mass lesion, midline shift, hydrocephalus or large infarct. Visualized paranasal sinuses are clear. No acute bone abnormality. IMPRESSION: No acute intracranial abnormality. Chronic scattered white matter changes. Findings are similar to the previous examination. Electronically Signed   By: Markus Daft M.D.   On: 12/08/2015 17:40   Ct Abdomen Pelvis W Contrast  12/08/2015  CLINICAL DATA:  Nausea and vomiting since last evening. EXAM: CT ABDOMEN AND PELVIS WITH CONTRAST TECHNIQUE: Multidetector CT imaging of the abdomen and pelvis was performed using the standard protocol following bolus administration of intravenous contrast. CONTRAST:  80 cc Omnipaque 350 COMPARISON:  None. FINDINGS: Lower chest: The lung bases are clear acute process. Mild emphysematous changes are noted. No pleural effusion or pulmonary lesions. Moderate pectus deformity. The heart is normal in size. Coronary artery calcifications are noted. There is moderate distal esophageal wall thickening. Findings could be due to reflux esophagitis and/or vomiting by could not exclude an infiltrating mass. Recommend correlation with bronchoscopy or contrast esophagram. Hepatobiliary: No focal hepatic lesions or intrahepatic biliary dilatation. The gallbladder is collapsed. There appears to be a small amount of air within it. Mild common bile duct dilatation measuring 8 mm. Suspect a small distal common bile duct calculus. Pancreas: No mass, inflammation or ductal dilatation. Spleen: Normal size.  No focal lesions. Adrenals/Urinary Tract: Enlarged left adrenal gland with nodularity. Probable adenomas but will need followup. The right adrenal gland is normal. Bilateral renal cysts. No worrisome renal lesions. No hydronephrosis. Stomach/Bowel: The stomach is distended with fluid. The duodenum and small bowel are dilated and fluid-filled  with air-fluid levels consistent with obstruction. There is a transition from dilated to nondilated small bowel in the upper pelvis anteriorly best seen on axial image number 60 and coronal image 49. No masses identified. There is probably an adhesion in this area. The colon is largely decompressed. Some stool noted in the sigmoid colon and rectum. Vascular/Lymphatic: Advanced atherosclerotic calcifications involving the aorta and branch vessels but no focal aneurysm or dissection. There are small scattered mesenteric and retroperitoneal lymph nodes but no mass or overt adenopathy. Other: The bladder, prostate gland and seminal vesicles are unremarkable. No pelvic mass or adenopathy. There is a small amount of free pelvic fluid. No inguinal adenopathy. Small left inguinal hernia containing fat. Musculoskeletal: Very mottled appearance of the bony structures. This could be progressive osteoporosis. Could not exclude metastatic disease or myeloma. Recommend correlation with clinical findings. IMPRESSION: 1. CT findings consistent with a small bowel obstruction likely due to adhesions in the anterior upper pelvis as described above. 2. Diffuse distal esophageal wall thickening could be due to esophagitis or related to vomiting. Endoscopy or contrast esophagram may be helpful for further evaluation. 3. Nodular enlargement of the left adrenal gland, this is likely due to benign adenomas but I would recommend a followup noncontrast abdominal CT scan and 4 months to document stability. 4. Advanced atherosclerotic calcifications involving the aorta and branch vessels but no aneurysm. 5. Borderline enlarged lymph nodes in the root of the small bowel mesenteric and a few in the retroperitoneum likely due to the small bowel obstruction. 6. Contracted gallbladder containing a small amount of air. There may be a small calculus in the distal common bile duct also. Followup ERCP or MRCP may be helpful for further evaluation.  Recommend correlation with liver function studies. 7. Very mottled appearance of the bony structures. This could be aggressive osteoporosis but could not exclude diffuse marrow process such as metastatic disease or myeloma. Electronically Signed   By: Marijo Sanes M.D.   On: 12/08/2015 17:49   Dg Abd Acute W/chest  12/09/2015  CLINICAL DATA:  Worsening nausea and vomiting, abdominal distension, small bowel obstruction EXAM: DG ABDOMEN ACUTE W/ 1V CHEST COMPARISON:  CT scan 12/08/2015 FINDINGS: Cardiomediastinal silhouette is unremarkable. No acute infiltrate or pulmonary edema. NG tube with tip in proximal stomach. Residual mild gaseous distended small bowel loops in mid abdomen with improvement from prior exam. Probable residual ileus or improving bowel obstruction. There is moderate gas noted within right colon and transverse colon. Contrast material from yesterday CT scan noted within urinary bladder. IMPRESSION: NG tube with tip in proximal stomach. Residual mild gaseous distended small bowel loops in mid abdomen with improvement from prior exam. Probable residual ileus or improving bowel obstruction. There is moderate gas noted within right colon and transverse colon. Contrast material from yesterday CT scan noted within urinary bladder. Electronically Signed   By: Lahoma Crocker M.D.   On: 12/09/2015 09:33    EKG:   Orders placed or performed during the hospital encounter of 12/08/15  . ED EKG  . ED EKG    ASSESSMENT AND PLAN:   #1 small bowel obstruction-management per surgical team.  -Keep him nothing by mouth, f/u surgeon. -No cardiac history, stroke was 5 years ago. Moderate risk for surgery at this time.  #2 lymphadenopathy noted in the abdomen-nonspecific lymphadenopathy and also bone marrow changes noted on CT abdomen-unexplainable small bowel obstruction on CT. PET scan per Dr. Rogue Bussing, oncologist.  #3 history of CVA, hold Aggrenox in case he needs surgery. Continue statin for  now  #4 hypertension-hold his blood pressure medication for now.  #5 hyponatremia-likely from dehydration, hypovolemia. He started on D5 lactated ringer right the surgeons-monitor sodium and adjust fluids.  #6 seizure disorder- continue phenytoin and Tegretol.  #7 esophagitis noted on CT-likely from recurrent episodes of vomiting since last night. -Continue Protonix twice a day for now  #8 tobacco use disorder-started on nicotine patch here  Hypokalemia. KCl iv, f/u BMP. Hematuria and possible UTI  Per UA, repeat UA and U/C, urologist consult prn.  I discussed with Dr. Rogue Bussing. Discussed with his daughter long time, all questions answered. All the records are reviewed and case discussed with Care Management/Social Workerr. Management plans discussed with the patient, daughter and they are in agreement.  CODE STATUS: full code  TOTAL TIME TAKING CARE OF THIS PATIENT: 43 minutes.  Greater than 50% time was spent on coordination of care and face-to-face counseling.  POSSIBLE D/C IN ?  DAYS, DEPENDING ON CLINICAL CONDITION.   Demetrios Loll M.D on 12/09/2015 at 2:03 PM  Between 7am to 6pm - Pager - [phone]  After 6pm go to [email or web address] - password [email or web address] [facility]  [facility]  Office  [phone]  CC: Primary care physician; No primary care provider on file.

## 2015-12-09 NOTE — Progress Notes (Signed)
Subjective: Feeling much better. No abdominal pain. I pulled his NG tube today no nausea and vomiting since then. He is hungry. KUB personal review there is significant improvement and there is actually some air in the right colon. Apparently the patient reports passing some gas and sure this is reliable the nurses were not able to confirm this. Creatinine and white count are improving  Objective: Vital signs in last 24 hours: Temp:  [97.9 F (36.6 C)-98.9 F (37.2 C)] 98.9 F (37.2 C) (02/01 2105) Pulse Rate:  [77-91] 77 (02/01 2105) Resp:  [18-20] 18 (02/01 2105) BP: (107-114)/(52-58) 114/55 mmHg (02/01 2105) SpO2:  [85 %-100 %] 100 % (02/01 2105) Weight:  [75.07 kg (165 lb 8 oz)] 75.07 kg (165 lb 8 oz) (01/31 2131) Last BM Date: 12/06/15  Intake/Output from previous day: 01/31 0701 - 02/01 0700 In: -  Out: 1250 [Urine:550; Emesis/NG output:700] Intake/Output this shift:    Physical exam: NAD awake alert Chest: rales bilaterally, NSR Abd: soft, hyperactive BS, non tender, no peritonitis, some distension Ext: no edema Neuro: gcs 15, awake alert, no focal deficits.  Lab Results: CBC   Recent Labs  12/08/15 1311 12/09/15 0647  WBC 13.0* 10.2  HGB 13.1 11.5*  HCT 38.7* 33.6*  PLT 326 262   BMET  Recent Labs  12/08/15 1311 12/09/15 0647  NA 129* 131*  K 3.8 3.2*  CL 90* 97*  CO2 31 29  GLUCOSE 130* 133*  BUN 20 22*  CREATININE 1.41* 0.95  CALCIUM 8.5* 7.9*   PT/INR No results for input(s): LABPROT, INR in the last 72 hours. ABG No results for input(s): PHART, HCO3 in the last 72 hours.  Invalid input(s): PCO2, PO2  Studies/Results: Dg Chest 2 View  12/08/2015  CLINICAL DATA:  Nausea, vomiting beginning last night around 10 p.m. Cough. EXAM: CHEST  2 VIEW COMPARISON:  09/12/2013 FINDINGS: Calcified granulomas in the right mid and lower lung. No confluent airspace opacities or effusions. No acute bony abnormality. Heart is normal size. No effusions.  IMPRESSION: No active cardiopulmonary disease.  Old granulomatous disease. Electronically Signed   By: Rolm Baptise M.D.   On: 12/08/2015 13:34   Ct Head Wo Contrast  12/08/2015  CLINICAL DATA:  Nausea and vomiting starting last night. EXAM: CT HEAD WITHOUT CONTRAST TECHNIQUE: Contiguous axial images were obtained from the base of the skull through the vertex without contrast. COMPARISON:  10/31/2010 FINDINGS: There is mild cerebral atrophy. Mild irregularity along the top of the lateral ventricles is similar to the prior examination. Again noted are scattered areas of low-density throughout the white matter suggesting chronic changes. No evidence for acute hemorrhage, mass lesion, midline shift, hydrocephalus or large infarct. Visualized paranasal sinuses are clear. No acute bone abnormality. IMPRESSION: No acute intracranial abnormality. Chronic scattered white matter changes. Findings are similar to the previous examination. Electronically Signed   By: Markus Daft M.D.   On: 12/08/2015 17:40   Ct Abdomen Pelvis W Contrast  12/08/2015  CLINICAL DATA:  Nausea and vomiting since last evening. EXAM: CT ABDOMEN AND PELVIS WITH CONTRAST TECHNIQUE: Multidetector CT imaging of the abdomen and pelvis was performed using the standard protocol following bolus administration of intravenous contrast. CONTRAST:  80 cc Omnipaque 350 COMPARISON:  None. FINDINGS: Lower chest: The lung bases are clear acute process. Mild emphysematous changes are noted. No pleural effusion or pulmonary lesions. Moderate pectus deformity. The heart is normal in size. Coronary artery calcifications are noted. There is moderate distal esophageal wall  thickening. Findings could be due to reflux esophagitis and/or vomiting by could not exclude an infiltrating mass. Recommend correlation with bronchoscopy or contrast esophagram. Hepatobiliary: No focal hepatic lesions or intrahepatic biliary dilatation. The gallbladder is collapsed. There appears  to be a small amount of air within it. Mild common bile duct dilatation measuring 8 mm. Suspect a small distal common bile duct calculus. Pancreas: No mass, inflammation or ductal dilatation. Spleen: Normal size.  No focal lesions. Adrenals/Urinary Tract: Enlarged left adrenal gland with nodularity. Probable adenomas but will need followup. The right adrenal gland is normal. Bilateral renal cysts. No worrisome renal lesions. No hydronephrosis. Stomach/Bowel: The stomach is distended with fluid. The duodenum and small bowel are dilated and fluid-filled with air-fluid levels consistent with obstruction. There is a transition from dilated to nondilated small bowel in the upper pelvis anteriorly best seen on axial image number 60 and coronal image 49. No masses identified. There is probably an adhesion in this area. The colon is largely decompressed. Some stool noted in the sigmoid colon and rectum. Vascular/Lymphatic: Advanced atherosclerotic calcifications involving the aorta and branch vessels but no focal aneurysm or dissection. There are small scattered mesenteric and retroperitoneal lymph nodes but no mass or overt adenopathy. Other: The bladder, prostate gland and seminal vesicles are unremarkable. No pelvic mass or adenopathy. There is a small amount of free pelvic fluid. No inguinal adenopathy. Small left inguinal hernia containing fat. Musculoskeletal: Very mottled appearance of the bony structures. This could be progressive osteoporosis. Could not exclude metastatic disease or myeloma. Recommend correlation with clinical findings. IMPRESSION: 1. CT findings consistent with a small bowel obstruction likely due to adhesions in the anterior upper pelvis as described above. 2. Diffuse distal esophageal wall thickening could be due to esophagitis or related to vomiting. Endoscopy or contrast esophagram may be helpful for further evaluation. 3. Nodular enlargement of the left adrenal gland, this is likely due to  benign adenomas but I would recommend a followup noncontrast abdominal CT scan and 4 months to document stability. 4. Advanced atherosclerotic calcifications involving the aorta and branch vessels but no aneurysm. 5. Borderline enlarged lymph nodes in the root of the small bowel mesenteric and a few in the retroperitoneum likely due to the small bowel obstruction. 6. Contracted gallbladder containing a small amount of air. There may be a small calculus in the distal common bile duct also. Followup ERCP or MRCP may be helpful for further evaluation. Recommend correlation with liver function studies. 7. Very mottled appearance of the bony structures. This could be aggressive osteoporosis but could not exclude diffuse marrow process such as metastatic disease or myeloma. Electronically Signed   By: Marijo Sanes M.D.   On: 12/08/2015 17:49   Dg Abd Acute W/chest  12/09/2015  CLINICAL DATA:  Worsening nausea and vomiting, abdominal distension, small bowel obstruction EXAM: DG ABDOMEN ACUTE W/ 1V CHEST COMPARISON:  CT scan 12/08/2015 FINDINGS: Cardiomediastinal silhouette is unremarkable. No acute infiltrate or pulmonary edema. NG tube with tip in proximal stomach. Residual mild gaseous distended small bowel loops in mid abdomen with improvement from prior exam. Probable residual ileus or improving bowel obstruction. There is moderate gas noted within right colon and transverse colon. Contrast material from yesterday CT scan noted within urinary bladder. IMPRESSION: NG tube with tip in proximal stomach. Residual mild gaseous distended small bowel loops in mid abdomen with improvement from prior exam. Probable residual ileus or improving bowel obstruction. There is moderate gas noted within right colon and transverse  colon. Contrast material from yesterday CT scan noted within urinary bladder. Electronically Signed   By: Lahoma Crocker M.D.   On: 12/09/2015 09:33    Anti-infectives: Anti-infectives    None       Assessment/Plan: Partial small bowel obstruction improving with medical Rx.  continue nothing by mouth, follow abdominal exams and abdominal series in the morning. No Need for surgical intervention at this time. Await for PET/CT and for the work Of myeloma per oncology. Acute kidney injury with significant improvement, we will decrease his maintenance IV fluids 100 cc an hour.   Caroleen Hamman, MD, [facility]  12/09/2015

## 2015-12-09 NOTE — Evaluation (Signed)
Physical Therapy Evaluation Patient Details Name: Elijah Evans MRN: [id number] DOB: August 18, 1946 Today's Date: 12/09/2015   History of Present Illness  presented to ER secondary to abdominal pain,nausea/vomiting; admitted with SOB of  unclear etiology.  Managing conservatively at this time.  Abdominal CT noted with significant bony changes throughout abdomen--question malignancy vs. severe osteoporosis; oncology consult pending.  Clinical Impression  Upon evaluation, patient alert and oriented to basic information; noted deficits in STM and very impulsive with all movement transitions.  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  Able to complete bed mobility with mod indep; sit/stand, basic transfers and very short-distance gait (5') with RW, cga/min assist.  No overt buckling or LOB, but frequent cuing for safety and management of lines/tubes.  Additional distance limited by NGT to continuous suction. Of note, patient with noted desat to 85% on RA with simple transfers; unable to recover beyond 88-89% on RA.  Required application of 3L supplemental O2 (per RN) for recovery to 90-91% end of session. Would benefit from skilled PT to address above deficits and promote optimal return to PLOF; Recommend transition to [place] upon discharge from acute hospitalization.  Will continue to assess and progress mobility as medically appropriate.     Follow Up Recommendations Home health PT    Equipment Recommendations  Rolling walker with 5" wheels    Recommendations for Other Services       Precautions / Restrictions Precautions Precautions: Fall Precaution Comments: NPO, NG tube to continuous suction Restrictions Weight Bearing Restrictions: No      Mobility  Bed Mobility Overal bed mobility: Modified Independent                Transfers Overall transfer level: Needs assistance Equipment used: Rolling walker (2  wheeled) Transfers: Sit to/from Stand Sit to Stand: Min guard         General transfer comment: very impulsive with movement transitions  Ambulation/Gait Ambulation/Gait assistance: Min guard Ambulation Distance (Feet): 5 Feet Assistive device: Rolling walker (2 wheeled)       General Gait Details: fair step height/length, no overt LOB; generally impulsive.  Additional distance deferred secondary to NGT to continuous suction.  Stairs            Wheelchair Mobility    Modified Rankin (Stroke Patients Only)       Balance Overall balance assessment: Needs assistance Sitting-balance support: No upper extremity supported;Feet supported Sitting balance-Leahy Scale: Good     Standing balance support: Bilateral upper extremity supported Standing balance-Leahy Scale: Fair                               Pertinent Vitals/Pain Pain Assessment: No/denies pain    Home Living Family/patient expects to be discharged to:: Private residence Living Arrangements: Alone Available Help at Discharge: Personal care attendant (aide 2-3 hours/day for assist with ADLs, household chores; daughter assists with community errands) Type of Home: House Home Access: Level entry     Home Layout: One level        Prior Function Level of Independence: Independent         Comments: Per chart and patient, tends to 'furniture cruise' throughout home; does endorse at least 2 falls in previous six months (one two days prior to this admission)     Hand Dominance        Extremity/Trunk Assessment   Upper Extremity  Assessment: Overall WFL for tasks assessed           Lower Extremity Assessment:  (L LE grossly 3-/5 (residual weakness due to CVA))         Communication   Communication: HOH  Cognition Arousal/Alertness: Awake/alert Behavior During Therapy: WFL for tasks assessed/performed Overall Cognitive Status: History of cognitive impairments - at baseline        Memory: Decreased short-term memory              General Comments      Exercises        Assessment/Plan    PT Assessment Patient needs continued PT services  PT Diagnosis Difficulty walking;Generalized weakness   PT Problem List Decreased strength;Decreased activity tolerance;Decreased balance;Decreased mobility;Decreased cognition;Decreased safety awareness  PT Treatment Interventions DME instruction;Gait training;Stair training;Functional mobility training;Therapeutic activities;Therapeutic exercise;Balance training;Patient/family education   PT Goals (Current goals can be found in the Care Plan section) Acute Rehab PT Goals Patient Stated Goal: "let's try the chair" PT Goal Formulation: With patient Time For Goal Achievement: 12/23/15 Potential to Achieve Goals: Good    Frequency Min 2X/week   Barriers to discharge        Co-evaluation               End of Session Equipment Utilized During Treatment: Gait belt Activity Tolerance: Patient tolerated treatment well (limited by NGT to continuous suction) Patient left: with call bell/phone within reach;with chair alarm set;in chair;with nursing/sitter in room Nurse Communication: Mobility status (O2 response to minimal activity)         Time: [id number] PT Time Calculation (min) (ACUTE ONLY): 21 min   Charges:   PT Evaluation $PT Eval Moderate Complexity: 1 Procedure     PT G Codes:        Kelce Bouton H. Owens Shark, PT, DPT, NCS 12/09/2015, 5:09 PM [phone]

## 2015-12-09 NOTE — Progress Notes (Signed)
Dr Bridgett Larsson notified that patient pulled NG tube out and refuses another.

## 2015-12-09 NOTE — Care Management (Signed)
Admitted to [facility] with the diagnosis of small bowel obstruction. Lives alone. Daughter/legal guardian is Mamadou Minicozzi. [phone]). Will bring guardianship papers to hospital today. Home Care Providers comes to his home 2-3 hours every day. Helps with light housekeeping, baths, medications. Furniture walks per daughter, uses no aids for ambulation. Last fall was 6 months ago. Last was at [facility] 6 months ago. Did see Dr. Harle Stanford, but has been assigned another physician. No home oxygen. Daughter did [employer], but he didn't wear, so daughter sent it back to company.  N/G placed. Shelbie Ammons RN MSN CCM Care Management [phone]

## 2015-12-09 NOTE — Progress Notes (Signed)
PT Cancellation Note  Patient Details Name: Elijah Evans MRN: [id number] DOB: 04-14-1946   Cancelled Treatment:    Reason Eval/Treat Not Completed: Patient at procedure or test/unavailable (Consult received and chart reviewed.  Patient currently off unit for diagnostic imaging.  Will re-attempt at later time/date as patient available and medically appropriate.)   Krystian Younglove H. Owens Shark, PT, DPT, NCS 12/09/2015, 9:15 AM [phone]

## 2015-12-09 NOTE — Consult Note (Signed)
[place] CONSULT NOTE  No care team member to display  CHIEF COMPLAINTS/PURPOSE OF CONSULTATION:  Possible multiple bone lesions as noted on the CT scan  HISTORY OF PRESENTING ILLNESS:  Patient is a fair historian at best/ no family by the bedside.   Elijah Evans 70 y.o.  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  His currently being managed on a conservative basis with the NG tube.    patient currently feels better.  He is  Patent attorney to eat.    His chronic shortness of breath is not any worse.  He denies any significant weight loss. Denies any unusual headaches.  No chest pain.  Has chronic joint pains;  However no worsening bone pain.   ROS: A complete 10 point review of system is done which is negative except mentioned above in history of present illness  MEDICAL HISTORY:  Past Medical History  Diagnosis Date  . Stroke ([place])   . TIA (transient ischemic attack)   . Hypertension   . Seizures ([place])     SURGICAL HISTORY: Past Surgical History  Procedure Laterality Date  . Foot surgery    . Tonsillectomy      SOCIAL HISTORY: Social History   Social History  . Marital Status: Single    Spouse Name: N/A  . Number of Children: N/A  . Years of Education: N/A   Occupational History  . Not on file.   Social History Main Topics  . Smoking status: Current Every Day Smoker -- 1.00 packs/day    Types: Cigarettes  . Smokeless tobacco: Not on file  . Alcohol Use: No     Comment: Used to drink a lot but quit 5 years ago.  . Drug Use: No  . Sexual Activity: Not on file   Other Topics Concern  . Not on file   Social History Narrative   Currently stays at home by himself. Daughter checks on him. Has a walker and a cane at home.   Has a nurse who checks on him every day.    FAMILY HISTORY: Denies any history of cancers in the family. Family  History  Problem Relation Age of Onset  . CAD Mother   . CAD Father     ALLERGIES:  has No Known Allergies.  MEDICATIONS:  Current Facility-Administered Medications  Medication Dose Route Frequency Provider Last Rate Last Dose  . carbamazepine (TEGRETOL) tablet 400 mg  400 mg Oral BID Gladstone Lighter, MD   400 mg at 12/08/15 2126  . dextrose 5 % in lactated ringers infusion   Intravenous Continuous Jules Husbands, MD 150 mL/hr at 12/09/15 1112    . heparin injection 5,000 Units  5,000 Units Subcutaneous 3 times per day Jules Husbands, MD   5,000 Units at 12/09/15 0528  . morphine 2 MG/ML injection 2 mg  2 mg Intravenous Q2H PRN Jules Husbands, MD   2 mg at 12/09/15 0012  . nicotine (NICODERM CQ - dosed in mg/24 hours) patch 14 mg  14 mg Transdermal Daily Jules Husbands, MD   14 mg at 12/09/15 0940  . ondansetron (ZOFRAN-ODT) disintegrating tablet 4 mg  4 mg Oral Q6H PRN Diego F Pabon, MD       Or  . ondansetron (ZOFRAN) injection 4 mg  4 mg Intravenous Q6H PRN Jules Husbands, MD   4  mg at 12/08/15 2022  . pantoprazole (PROTONIX) injection 40 mg  40 mg Intravenous Q12H Gladstone Lighter, MD   40 mg at 12/09/15 0940  . phenytoin (DILANTIN) injection 100 mg  100 mg Intravenous Q6H Jules Husbands, MD   100 mg at 12/09/15 0939  . potassium chloride 10 mEq in 100 mL IVPB  10 mEq Intravenous Q1 Hr x 4 Demetrios Loll, MD      . simvastatin (ZOCOR) tablet 40 mg  40 mg Oral QHS Gladstone Lighter, MD   40 mg at 12/08/15 2126      .  PHYSICAL EXAMINATION:   Filed Vitals:   12/08/15 2131 12/09/15 0425  BP: 107/58 109/58  Pulse: 91 90  Temp: 97.9 F (36.6 C) 98.6 F (37 C)  Resp: 20 18   Filed Weights   12/08/15 1235 12/08/15 2131  Weight: 185 lb (83.915 kg) 165 lb 8 oz (75.07 kg)    GENERAL: Well-nourished; Alert, no distress and comfortable.  He is alone. Sitting in a chair. He has an NG tube in place. EYES: no pallor or icterus OROPHARYNX: no thrush or ulceration; poor dentition  NECK:  supple, no masses felt LYMPH:  no palpable lymphadenopathy in the cervical, axillary or inguinal regions LUNGS:  Decreased breath sounds bilaterally.and  No wheeze or crackles HEART/CVS: regular rate & rhythm and no murmurs; No lower extremity edema ABDOMEN: abdomen soft, non-tender and normal bowel sounds;  Distended. Musculoskeletal:no cyanosis of digits and no clubbing  PSYCH: alert & oriented x 3 with fluent speech NEURO: no focal motor/sensory deficits SKIN:  no rashes or significant lesions  LABORATORY DATA:  I have reviewed the data as listed Lab Results  Component Value Date   WBC 10.2 12/09/2015   HGB 11.5* 12/09/2015   HCT 33.6* 12/09/2015   MCV 95.2 12/09/2015   PLT 262 12/09/2015    Recent Labs  12/08/15 1311 12/09/15 0647  NA 129* 131*  K 3.8 3.2*  CL 90* 97*  CO2 31 29  GLUCOSE 130* 133*  BUN 20 22*  CREATININE 1.41* 0.95  CALCIUM 8.5* 7.9*  GFRNONAA 48* >60  GFRAA 56* >60  PROT 6.5  --   ALBUMIN 3.4*  --   AST 21  --   ALT 13*  --   ALKPHOS 130*  --   BILITOT 0.5  --     RADIOGRAPHIC STUDIES: I have personally reviewed the radiological images as listed and agreed with the findings in the report. Dg Chest 2 View  12/08/2015  CLINICAL DATA:  Nausea, vomiting beginning last night around 10 p.m. Cough. EXAM: CHEST  2 VIEW COMPARISON:  09/12/2013 FINDINGS: Calcified granulomas in the right mid and lower lung. No confluent airspace opacities or effusions. No acute bony abnormality. Heart is normal size. No effusions. IMPRESSION: No active cardiopulmonary disease.  Old granulomatous disease. Electronically Signed   By: Rolm Baptise M.D.   On: 12/08/2015 13:34   Ct Head Wo Contrast  12/08/2015  CLINICAL DATA:  Nausea and vomiting starting last night. EXAM: CT HEAD WITHOUT CONTRAST TECHNIQUE: Contiguous axial images were obtained from the base of the skull through the vertex without contrast. COMPARISON:  10/31/2010 FINDINGS: There is mild cerebral atrophy. Mild  irregularity along the top of the lateral ventricles is similar to the prior examination. Again noted are scattered areas of low-density throughout the white matter suggesting chronic changes. No evidence for acute hemorrhage, mass lesion, midline shift, hydrocephalus or large infarct. Visualized paranasal sinuses are clear.  No acute bone abnormality. IMPRESSION: No acute intracranial abnormality. Chronic scattered white matter changes. Findings are similar to the previous examination. Electronically Signed   By: Markus Daft M.D.   On: 12/08/2015 17:40   Ct Abdomen Pelvis W Contrast  12/08/2015  CLINICAL DATA:  Nausea and vomiting since last evening. EXAM: CT ABDOMEN AND PELVIS WITH CONTRAST TECHNIQUE: Multidetector CT imaging of the abdomen and pelvis was performed using the standard protocol following bolus administration of intravenous contrast. CONTRAST:  80 cc Omnipaque 350 COMPARISON:  None. FINDINGS: Lower chest: The lung bases are clear acute process. Mild emphysematous changes are noted. No pleural effusion or pulmonary lesions. Moderate pectus deformity. The heart is normal in size. Coronary artery calcifications are noted. There is moderate distal esophageal wall thickening. Findings could be due to reflux esophagitis and/or vomiting by could not exclude an infiltrating mass. Recommend correlation with bronchoscopy or contrast esophagram. Hepatobiliary: No focal hepatic lesions or intrahepatic biliary dilatation. The gallbladder is collapsed. There appears to be a small amount of air within it. Mild common bile duct dilatation measuring 8 mm. Suspect a small distal common bile duct calculus. Pancreas: No mass, inflammation or ductal dilatation. Spleen: Normal size.  No focal lesions. Adrenals/Urinary Tract: Enlarged left adrenal gland with nodularity. Probable adenomas but will need followup. The right adrenal gland is normal. Bilateral renal cysts. No worrisome renal lesions. No hydronephrosis.  Stomach/Bowel: The stomach is distended with fluid. The duodenum and small bowel are dilated and fluid-filled with air-fluid levels consistent with obstruction. There is a transition from dilated to nondilated small bowel in the upper pelvis anteriorly best seen on axial image number 60 and coronal image 49. No masses identified. There is probably an adhesion in this area. The colon is largely decompressed. Some stool noted in the sigmoid colon and rectum. Vascular/Lymphatic: Advanced atherosclerotic calcifications involving the aorta and branch vessels but no focal aneurysm or dissection. There are small scattered mesenteric and retroperitoneal lymph nodes but no mass or overt adenopathy. Other: The bladder, prostate gland and seminal vesicles are unremarkable. No pelvic mass or adenopathy. There is a small amount of free pelvic fluid. No inguinal adenopathy. Small left inguinal hernia containing fat. Musculoskeletal: Very mottled appearance of the bony structures. This could be progressive osteoporosis. Could not exclude metastatic disease or myeloma. Recommend correlation with clinical findings. IMPRESSION: 1. CT findings consistent with a small bowel obstruction likely due to adhesions in the anterior upper pelvis as described above. 2. Diffuse distal esophageal wall thickening could be due to esophagitis or related to vomiting. Endoscopy or contrast esophagram may be helpful for further evaluation. 3. Nodular enlargement of the left adrenal gland, this is likely due to benign adenomas but I would recommend a followup noncontrast abdominal CT scan and 4 months to document stability. 4. Advanced atherosclerotic calcifications involving the aorta and branch vessels but no aneurysm. 5. Borderline enlarged lymph nodes in the root of the small bowel mesenteric and a few in the retroperitoneum likely due to the small bowel obstruction. 6. Contracted gallbladder containing a small amount of air. There may be a small  calculus in the distal common bile duct also. Followup ERCP or MRCP may be helpful for further evaluation. Recommend correlation with liver function studies. 7. Very mottled appearance of the bony structures. This could be aggressive osteoporosis but could not exclude diffuse marrow process such as metastatic disease or myeloma. Electronically Signed   By: Marijo Sanes M.D.   On: 12/08/2015 17:49  Dg Abd Acute W/chest  12/09/2015  CLINICAL DATA:  Worsening nausea and vomiting, abdominal distension, small bowel obstruction EXAM: DG ABDOMEN ACUTE W/ 1V CHEST COMPARISON:  CT scan 12/08/2015 FINDINGS: Cardiomediastinal silhouette is unremarkable. No acute infiltrate or pulmonary edema. NG tube with tip in proximal stomach. Residual mild gaseous distended small bowel loops in mid abdomen with improvement from prior exam. Probable residual ileus or improving bowel obstruction. There is moderate gas noted within right colon and transverse colon. Contrast material from yesterday CT scan noted within urinary bladder. IMPRESSION: NG tube with tip in proximal stomach. Residual mild gaseous distended small bowel loops in mid abdomen with improvement from prior exam. Probable residual ileus or improving bowel obstruction. There is moderate gas noted within right colon and transverse colon. Contrast material from yesterday CT scan noted within urinary bladder. Electronically Signed   By: Lahoma Crocker M.D.   On: 12/09/2015 09:33    ASSESSMENT & PLAN:   #  70 year old male patient currently admitted to the hospital for  Small bowel obstruction conservatively managed.  #  Incidental mottled appearance of the bone- severe osteoporosis versus malignancy. I will order myeloma workup; also PSA.   # Small bowel obstruction/ Long-standing history of smoking/Acute renal failure- improving.   Thank you allowing me to participate in the care of your pleasant patient. Please do not hesitate to contact me with questions or  concerns in the interim. I will follow.      Cammie Sickle, MD 12/09/2015 12:43 PM

## 2015-12-09 NOTE — Progress Notes (Signed)
Notified Dr Azalee Course that patient pulled out NG tube and refuses another one.

## 2015-12-10 ENCOUNTER — Inpatient Hospital Stay: Payer: Medicare Other

## 2015-12-10 ENCOUNTER — Encounter: Payer: Self-pay | Admitting: Radiology

## 2015-12-10 DIAGNOSIS — K5669 Other intestinal obstruction: Secondary | ICD-10-CM

## 2015-12-10 LAB — BASIC METABOLIC PANEL
ANION GAP: 4 — AB (ref 5–15)
BUN: 14 mg/dL (ref 6–20)
CO2: 30 mmol/L (ref 22–32)
Calcium: 7.9 mg/dL — ABNORMAL LOW (ref 8.9–10.3)
Chloride: 100 mmol/L — ABNORMAL LOW (ref 101–111)
Creatinine, Ser: 0.65 mg/dL (ref 0.61–1.24)
GFR calc Af Amer: >60 mL/min (ref >60)
GFR calc non Af Amer: >60 mL/min (ref >60)
GLUCOSE: 106 mg/dL — AB (ref 65–99)
POTASSIUM: 3.5 mmol/L (ref 3.5–5.1)
Sodium: 134 mmol/L — ABNORMAL LOW (ref 135–145)

## 2015-12-10 LAB — CBC
HEMATOCRIT: 31.5 % — AB (ref 40.0–52.0)
HEMOGLOBIN: 10.8 g/dL — AB (ref 13.0–18.0)
MCH: 33 pg (ref 26.0–34.0)
MCHC: 34.4 g/dL (ref 32.0–36.0)
MCV: 96 fL (ref 80.0–100.0)
Platelets: 223 10*3/uL (ref 150–440)
RBC: 3.28 MIL/uL — AB (ref 4.40–5.90)
RDW: 14.7 % — ABNORMAL HIGH (ref 11.5–14.5)
WBC: 5.3 10*3/uL (ref 3.8–10.6)

## 2015-12-10 LAB — KAPPA/LAMBDA LIGHT CHAINS
Kappa free light chain: 28.01 mg/L — ABNORMAL HIGH (ref 3.30–19.40)
Kappa, lambda light chain ratio: 1.16 (ref 0.26–1.65)
Lambda free light chains: 24.17 mg/L (ref 5.71–26.30)

## 2015-12-10 LAB — PSA: PSA: 0.21 ng/mL (ref 0.00–4.00)

## 2015-12-10 MED ORDER — SODIUM CHLORIDE 0.9 % IV SOLN
INTRAVENOUS | Status: DC
  Administered 2015-12-10 – 2015-12-11 (×4): via INTRAVENOUS

## 2015-12-10 MED ORDER — ACETAMINOPHEN 325 MG PO TABS: 650.0000 mg | ORAL_TABLET | Freq: Four times a day (QID) | ORAL | Status: DC | PRN

## 2015-12-10 MED ORDER — IOHEXOL 300 MG/ML  SOLN
75.0000 mL | Freq: Once | INTRAMUSCULAR | Status: AC | PRN
  Administered 2015-12-10: 19:00:00 75 mL via INTRAVENOUS

## 2015-12-10 MED ORDER — PHENYTOIN SODIUM EXTENDED 30 MG PO CAPS
60.0000 mg | ORAL_CAPSULE | Freq: Two times a day (BID) | ORAL | Status: DC
  Administered 2015-12-10 – 2015-12-12 (×5): 60 mg via ORAL
  Filled 2015-12-10 (×7): qty 2

## 2015-12-10 MED ORDER — PHENYTOIN SODIUM EXTENDED 100 MG PO CAPS
200.0000 mg | ORAL_CAPSULE | Freq: Every day | ORAL | Status: DC
  Administered 2015-12-10 – 2015-12-11 (×2): 200 mg via ORAL
  Filled 2015-12-10 (×2): qty 2

## 2015-12-10 MED ORDER — PNEUMOCOCCAL VAC POLYVALENT 25 MCG/0.5ML IJ INJ
0.5000 mL | INJECTION | INTRAMUSCULAR | Status: AC
  Administered 2015-12-11: 0.5 mL via INTRAMUSCULAR
  Filled 2015-12-10: qty 0.5

## 2015-12-10 NOTE — Progress Notes (Signed)
Speech Therapy note: received order, reviewed chart notes and consulted NSG re: pt's status. Pt is NPO awaiting tests per NSG, including a KUB. Noted GI issues including esophagitis d/t recurrent episodes of N/V. Rec. Oral care. ST will f/u when pt has completed tests and when time allows. NSG/CM agreed.

## 2015-12-10 NOTE — Plan of Care (Signed)
Problem: Bowel/Gastric: Goal: Will not experience complications related to bowel motility Outcome: Progressing No N/V, BM small smear- pt reports he is passing gas. Abd xray this morning

## 2015-12-10 NOTE — Progress Notes (Signed)
70 yr old partial small bowel, improving.  Patient had BM today, passing flatus.  He denies feeling nasueated. Would like something to eat.   Filed Vitals:   12/10/15 0520 12/10/15 1337  BP: 123/61 112/67  Pulse: 74 72  Temp: 98.3 F (36.8 C) 98.4 F (36.9 C)  Resp:  20   I/O last 3 completed shifts: In: [id number] [I.V.:2797; IV Piggyback:447] Out: 1950 [Urine:1250; Emesis/NG output:700] Total I/O In: 760 [P.O.:760] Out: 150 [Urine:150]   PE:  GEN: NAD ABD: soft, non-tender, non distended Ext: 2+ pulses soft.   CBC Latest Ref Rng 12/10/2015 12/09/2015 12/08/2015  WBC 3.8 - 10.6 K/uL 5.3 10.2 13.0(H)  Hemoglobin 13.0 - 18.0 g/dL 10.8(L) 11.5(L) 13.1  Hematocrit 40.0 - 52.0 % 31.5(L) 33.6(L) 38.7(L)  Platelets 150 - 440 K/uL 223 262 326   CMP Latest Ref Rng 12/10/2015 12/09/2015 12/08/2015  Glucose 65 - 99 mg/dL 106(H) 133(H) 130(H)  BUN 6 - 20 mg/dL 14 22(H) 20  Creatinine 0.61 - 1.24 mg/dL 0.65 0.95 1.41(H)  Sodium 135 - 145 mmol/L 134(L) 131(L) 129(L)  Potassium 3.5 - 5.1 mmol/L 3.5 3.2(L) 3.8  Chloride 101 - 111 mmol/L 100(L) 97(L) 90(L)  CO2 22 - 32 mmol/L 30 29 31   Calcium 8.9 - 10.3 mg/dL 7.9(L) 7.9(L) 8.5(L)  Total Protein 6.5 - 8.1 g/dL - - 6.5  Total Bilirubin 0.3 - 1.2 mg/dL - - 0.5  Alkaline Phos 38 - 126 U/L - - 130(H)  AST 15 - 41 U/L - - 21  ALT 17 - 63 U/L - - 13(L)    A/P:  Partial small bowel obstruction improving, having BM  Appreciate help from hospitalist and oncology.   Some concern for dysphasia: can start clear liquids from my standpoint, awaiting speech eval PET/CT Chest to continue w/u for possible myeloma Acute kidney injury resolved.

## 2015-12-10 NOTE — Care Management Important Message (Signed)
Important Message  Patient Details  Name: Elijah Evans MRN: [id number] Date of Birth: 24-Feb-1946   Medicare Important Message Given:  Yes    Juliann Pulse A Minna Dumire 12/10/2015, 9:54 AM

## 2015-12-10 NOTE — Progress Notes (Signed)
[facility] NAME: Elijah Evans    MR#:  [id number]  DATE OF BIRTH:  [birth year]  SUBJECTIVE:  CHIEF COMPLAINT:   Chief Complaint  Patient presents with  . Emesis  . Weakness  . Headache   -Abdomen is soft, denies any pain. No nausea or vomiting. -Awaiting KUB at this time. Remains nothing by mouth. Passing flatus. No bowel movement yet. Congested with gurgling sounds in his throat. Denies any dyspnea or breathing difficulty.  REVIEW OF SYSTEMS:   Constitutional: Negative for fever, chills, weight loss and malaise/fatigue.  HENT: Negative for ear discharge, ear pain, hearing loss and nosebleeds.  Eyes: Negative for blurred vision, double vision and photophobia.  Respiratory: Negative for cough, hemoptysis, shortness of breath and wheezing.  Cardiovascular: Negative for chest pain, palpitations, orthopnea and leg swelling.  Gastrointestinal: No nausea vomiting or abdominal pain. Negative for heartburn, diarrhea, constipation and melena.  Genitourinary: Negative for dysuria, urgency, frequency and hematuria.  Musculoskeletal: Negative for myalgias, back pain and neck pain.  Skin: Negative for rash.  Neurological: Negative for dizziness, tremors, sensory change, speech change, focal weakness and headaches.  Endo/Heme/Allergies: Does not bruise/bleed easily.  Psychiatric/Behavioral: Negative for depression.   DRUG ALLERGIES:  No Known Allergies  VITALS:  Blood pressure 123/61, pulse 74, temperature 98.3 F (36.8 C), temperature source Oral, resp. rate 18, height 6' (1.829 m), weight 75.07 kg (165 lb 8 oz), SpO2 98 %.  PHYSICAL EXAMINATION:   GENERAL: 70 y.o.-year-old patient lying in the bed with no acute distress.  EYES: Pupils are unequal, right pupil is slightly enlarged than the left one but both are round, reactive to light and accommodation. No scleral icterus. Extraocular muscles intact.  HEENT: Head  atraumatic, normocephalic. Oropharynx and nasopharynx clear. left facial droop is noted. NECK: Supple, no jugular venous distention. No thyroid enlargement, no tenderness.  LUNGS: Normal breath sounds bilaterally, no wheezing, rales,rhonchi or crepitation. No use of accessory muscles of respiration. Decreased bibasilar breath sounds. Some gurgling congestion noted mostly in the upper airway. CARDIOVASCULAR: S1, S2 normal. No rubs, or gallops. 3/6 systolic murmur is present ABDOMEN: Soft, nontender, nondistended. Bowel sounds present. No organomegaly or mass.  EXTREMITIES: No pedal edema, cyanosis, or clubbing.  NEUROLOGIC: Cranial nerves II through XII are intact. Muscle strength 5/5 in all extremities with giveaway weakness on the left side. Sensation intact. Gait not checked. left facial droop noted. PSYCHIATRIC: The patient is alert and oriented x 3. Slow to respond. SKIN: No obvious rash, lesion, or ulcer.    LABORATORY PANEL:   CBC  Recent Labs Lab 12/10/15 0635  WBC 5.3  HGB 10.8*  HCT 31.5*  PLT 223   ------------------------------------------------------------------------------------------------------------------  Chemistries   Recent Labs Lab 12/08/15 1311 12/09/15 0647 12/10/15 0635  NA 129* 131* 134*  K 3.8 3.2* 3.5  CL 90* 97* 100*  CO2 31 29 30   GLUCOSE 130* 133* 106*  BUN 20 22* 14  CREATININE 1.41* 0.95 0.65  CALCIUM 8.5* 7.9* 7.9*  MG  --  1.7  --   AST 21  --   --   ALT 13*  --   --   ALKPHOS 130*  --   --   BILITOT 0.5  --   --    ------------------------------------------------------------------------------------------------------------------  Cardiac Enzymes No results for input(s): TROPONINI in the last 168 hours. ------------------------------------------------------------------------------------------------------------------  RADIOLOGY:  Dg Chest 2 View  12/08/2015  CLINICAL DATA:  Nausea, vomiting beginning last night  around 10 p.m.  Cough. EXAM: CHEST  2 VIEW COMPARISON:  09/12/2013 FINDINGS: Calcified granulomas in the right mid and lower lung. No confluent airspace opacities or effusions. No acute bony abnormality. Heart is normal size. No effusions. IMPRESSION: No active cardiopulmonary disease.  Old granulomatous disease. Electronically Signed   By: Rolm Baptise M.D.   On: 12/08/2015 13:34   Ct Head Wo Contrast  12/08/2015  CLINICAL DATA:  Nausea and vomiting starting last night. EXAM: CT HEAD WITHOUT CONTRAST TECHNIQUE: Contiguous axial images were obtained from the base of the skull through the vertex without contrast. COMPARISON:  10/31/2010 FINDINGS: There is mild cerebral atrophy. Mild irregularity along the top of the lateral ventricles is similar to the prior examination. Again noted are scattered areas of low-density throughout the white matter suggesting chronic changes. No evidence for acute hemorrhage, mass lesion, midline shift, hydrocephalus or large infarct. Visualized paranasal sinuses are clear. No acute bone abnormality. IMPRESSION: No acute intracranial abnormality. Chronic scattered white matter changes. Findings are similar to the previous examination. Electronically Signed   By: Markus Daft M.D.   On: 12/08/2015 17:40   Ct Abdomen Pelvis W Contrast  12/08/2015  CLINICAL DATA:  Nausea and vomiting since last evening. EXAM: CT ABDOMEN AND PELVIS WITH CONTRAST TECHNIQUE: Multidetector CT imaging of the abdomen and pelvis was performed using the standard protocol following bolus administration of intravenous contrast. CONTRAST:  80 cc Omnipaque 350 COMPARISON:  None. FINDINGS: Lower chest: The lung bases are clear acute process. Mild emphysematous changes are noted. No pleural effusion or pulmonary lesions. Moderate pectus deformity. The heart is normal in size. Coronary artery calcifications are noted. There is moderate distal esophageal wall thickening. Findings could be due to reflux esophagitis and/or vomiting by  could not exclude an infiltrating mass. Recommend correlation with bronchoscopy or contrast esophagram. Hepatobiliary: No focal hepatic lesions or intrahepatic biliary dilatation. The gallbladder is collapsed. There appears to be a small amount of air within it. Mild common bile duct dilatation measuring 8 mm. Suspect a small distal common bile duct calculus. Pancreas: No mass, inflammation or ductal dilatation. Spleen: Normal size.  No focal lesions. Adrenals/Urinary Tract: Enlarged left adrenal gland with nodularity. Probable adenomas but will need followup. The right adrenal gland is normal. Bilateral renal cysts. No worrisome renal lesions. No hydronephrosis. Stomach/Bowel: The stomach is distended with fluid. The duodenum and small bowel are dilated and fluid-filled with air-fluid levels consistent with obstruction. There is a transition from dilated to nondilated small bowel in the upper pelvis anteriorly best seen on axial image number 60 and coronal image 49. No masses identified. There is probably an adhesion in this area. The colon is largely decompressed. Some stool noted in the sigmoid colon and rectum. Vascular/Lymphatic: Advanced atherosclerotic calcifications involving the aorta and branch vessels but no focal aneurysm or dissection. There are small scattered mesenteric and retroperitoneal lymph nodes but no mass or overt adenopathy. Other: The bladder, prostate gland and seminal vesicles are unremarkable. No pelvic mass or adenopathy. There is a small amount of free pelvic fluid. No inguinal adenopathy. Small left inguinal hernia containing fat. Musculoskeletal: Very mottled appearance of the bony structures. This could be progressive osteoporosis. Could not exclude metastatic disease or myeloma. Recommend correlation with clinical findings. IMPRESSION: 1. CT findings consistent with a small bowel obstruction likely due to adhesions in the anterior upper pelvis as described above. 2. Diffuse distal  esophageal wall thickening could be due to esophagitis or related to vomiting. Endoscopy or  contrast esophagram may be helpful for further evaluation. 3. Nodular enlargement of the left adrenal gland, this is likely due to benign adenomas but I would recommend a followup noncontrast abdominal CT scan and 4 months to document stability. 4. Advanced atherosclerotic calcifications involving the aorta and branch vessels but no aneurysm. 5. Borderline enlarged lymph nodes in the root of the small bowel mesenteric and a few in the retroperitoneum likely due to the small bowel obstruction. 6. Contracted gallbladder containing a small amount of air. There may be a small calculus in the distal common bile duct also. Followup ERCP or MRCP may be helpful for further evaluation. Recommend correlation with liver function studies. 7. Very mottled appearance of the bony structures. This could be aggressive osteoporosis but could not exclude diffuse marrow process such as metastatic disease or myeloma. Electronically Signed   By: Marijo Sanes M.D.   On: 12/08/2015 17:49   Dg Abd Acute W/chest  12/09/2015  CLINICAL DATA:  Worsening nausea and vomiting, abdominal distension, small bowel obstruction EXAM: DG ABDOMEN ACUTE W/ 1V CHEST COMPARISON:  CT scan 12/08/2015 FINDINGS: Cardiomediastinal silhouette is unremarkable. No acute infiltrate or pulmonary edema. NG tube with tip in proximal stomach. Residual mild gaseous distended small bowel loops in mid abdomen with improvement from prior exam. Probable residual ileus or improving bowel obstruction. There is moderate gas noted within right colon and transverse colon. Contrast material from yesterday CT scan noted within urinary bladder. IMPRESSION: NG tube with tip in proximal stomach. Residual mild gaseous distended small bowel loops in mid abdomen with improvement from prior exam. Probable residual ileus or improving bowel obstruction. There is moderate gas noted within right  colon and transverse colon. Contrast material from yesterday CT scan noted within urinary bladder. Electronically Signed   By: Lahoma Crocker M.D.   On: 12/09/2015 09:33    EKG:   Orders placed or performed during the hospital encounter of 12/08/15  . ED EKG  . ED EKG    ASSESSMENT AND PLAN:   #1 Small bowel obstruction-management per surgical team.  -Passing flatus, no bowel movement yet. -KUB from yesterday with some improvement. Follow-up KUB today. Remains nothing by mouth.  - Improving. NG tube pulled out by patient.  #2 lymphadenopathy noted in the abdomen-nonspecific lymphadenopathy and also bone marrow changes noted on CT abdomen-unexplainable small bowel obstruction on CT. - Appreciate oncology consult. PSA, SPEP and UPEP ordered, workup for myeloma under progress. -No PET scan ordered by oncology-Will recheck -This can be followed up as an outpatient  #3 history of CVA, hold Aggrenox in case he needs surgery. Continue statin for now Also has some dysphagia likely. Hypoxic with sats 98% on 3L- check a CXR to r/o aspiration  #4 hypertension-hold his blood pressure medications for now.  #5 hyponatremia-likely from dehydration, hypovolemia. started on D5 lactated ringer-monitor sodium and adjust fluids. -If diet is started today, change fluids to normal saline  #6 seizure disorder- continue phenytoin and Tegretol.  #7 esophagitis noted on CT-likely from recurrent episodes of vomiting on admission. -Continue Protonix twice a day for now  #8 tobacco use disorder-started on nicotine patch here    All the records are reviewed and case discussed with Care Management/Social Workerr. Management plans discussed with the patient, daughter and they are in agreement.  CODE STATUS: full code  TOTAL TIME TAKING CARE OF THIS PATIENT: 37 minutes.   POSSIBLE D/C IN 2-3 DAYS, DEPENDING ON CLINICAL CONDITION.   Gladstone Lighter M.D on 12/10/2015 at 8:33  AM  Between 7am to 6pm -  Pager - [phone]  After 6pm go to [email or web address] - password [email or web address] [facility]  [facility]  Office  [phone]  CC: Primary care physician; No primary care provider on file.

## 2015-12-10 NOTE — Evaluation (Signed)
Clinical/Bedside Swallow Evaluation Patient Details  Name: Elijah Evans MRN: [id number] Date of Birth: 07-14-1946  Today's Date: 12/10/2015 Time: SLP Start Time (ACUTE ONLY): 1500 SLP Stop Time (ACUTE ONLY): 1545 SLP Time Calculation (min) (ACUTE ONLY): 45 min  Past Medical History:  Past Medical History  Diagnosis Date  . Stroke ([place])   . TIA (transient ischemic attack)   . Hypertension   . Seizures ([place])    Past Surgical History:  Past Surgical History  Procedure Laterality Date  . Foot surgery    . Tonsillectomy     HPI:  Pt is a 70 y.o. male with a history of diffuse abdominal pain, nausea and vomiting. He had more room stool yesterday as well. Currently he denies any abdominal pain. Patient abdominal pain is intermittent moderate. Please note that the patient has some difficulty remembering things since he had 2 previous strokes and multiple TIAs. Most of the history is taken from the daughter that was present at the bedside and is the caregiver of the pt. It is difficult to really establish any particular symptoms. There was a questionable weight loss, he does not see doctors frequently. He does smoke 1 pack per day and per the daughter he has been having productive cough and sputum for the last 2 days or so. There has no be any evidence of previous abdominal surgery the only surgery he has had is an ankle surgery several years ago. Pt is currently on a clear liquid diet for GI reasons and per MD post morning tests. Pt verbally conversive; followed instructions. Pt appeared quite HOH. Pt stated he had Left oral-facial weakness from previous CVA ~5 yrs ago. Speech fairly intelligible - pt does not wear lower dentures(tops only). Pt stated he was at his baseline in these areas of speech-language abilities. Pt denied any trouble swallowing foods/liquids.    Assessment / Plan / Recommendation Clinical Impression  Pt appeared to adequately tolerate trials of thin liquids via straw  and 3 tsp trials of applesauce (puree) w/ no immediate, overt s/s of aspiration noted; a mildly delayed, half throat clear was noted x1 during multiple sips but not consistently w/ po trials and this did not increase in presence during po trials. Pt demo. clear vocal quality when he verbally responded b/t trials; no decline in respiratory status was noted during/post trials though the crackly, wet respirations were noted during breathing at rest b/t trials - similar to his respiratory status at baseline. Pt was only assessed w/ thin liquids and 3 trials of puree sec. to GI restrictions of his diet at this time per MD. No oral phase deficits were noted during trials of liquids or puree. Suspect pt may be at his baseline; pt denied any difficulty swallowing as well. Rec. continue w/ current diet as ordered by MD w/ upgrade when appropriate per MD. Pt educated on general aspiration precautions. NSG to reconsult if any decline in status is noted or if any further skilled ST services are indicated.     Aspiration Risk   (reduced following general aspiration precautions)    Diet Recommendation  clear liquids at this time per MD d/t GI status; may upgrade to mech soft (soft foods, moist) when appropriate per MD; aspiration precautions  Medication Administration: Whole meds with liquid (w/ puree if nec. for easier swallowing)    Other  Recommendations Recommended Consults:  (Dietician) Oral Care Recommendations: Oral care BID;Patient independent with oral care   Follow up Recommendations   (TBD )  Frequency and Duration            Prognosis Prognosis for Safe Diet Advancement: Good      Swallow Study   General Date of Onset: 12/08/15 HPI: Pt is a 69 y.o. male with a history of diffuse abdominal pain, nausea and vomiting. He had more room stool yesterday as well. Currently he denies any abdominal pain. Patient abdominal pain is intermittent moderate. Please note that the patient has some difficulty  remembering things since he had 2 previous strokes and multiple TIAs. Most of the history is taken from the daughter that was present at the bedside and is the caregiver of the pt. It is difficult to really establish any particular symptoms. There was a questionable weight loss, he does not see doctors frequently. He does smoke 1 pack per day and per the daughter he has been having productive cough and sputum for the last 2 days or so. There has no be any evidence of previous abdominal surgery the only surgery he has had is an ankle surgery several years ago. Pt is currently on a clear liquid diet for GI reasons and per MD post morning tests. Pt verbally conversive; followed instructions. Pt appeared quite HOH. Pt stated he had Left oral-facial weakness from previous CVA ~5 yrs ago. Speech fairly intelligible - pt does not wear lower dentures(tops only). Pt stated he was at his baseline in these areas of speech-language abilities. Pt denied any trouble swallowing foods/liquids.  Type of Study: Bedside Swallow Evaluation Previous Swallow Assessment: none indicated Diet Prior to this Study: Regular;Thin liquids Temperature Spikes Noted: No (wbc 5.3) Respiratory Status: Room air History of Recent Intubation: No Behavior/Cognition: Alert;Cooperative;Pleasant mood (HOH) Oral Cavity Assessment: Within Functional Limits;Dry Oral Care Completed by SLP: Recent completion by staff Oral Cavity - Dentition: Dentures, top (only) Vision: Functional for self-feeding Self-Feeding Abilities: Able to feed self Patient Positioning: Upright in bed Baseline Vocal Quality: Normal Volitional Cough: Strong Volitional Swallow: Able to elicit    Oral/Motor/Sensory Function Overall Oral Motor/Sensory Function: Within functional limits   Ice Chips Ice chips: Not tested   Thin Liquid Thin Liquid: Within functional limits Presentation: Straw;Self Fed (5-6 trial swallows)    Nectar Thick Nectar Thick Liquid: Not tested    Honey Thick Honey Thick Liquid: Not tested   Puree Puree: Within functional limits Presentation: Self Fed;Spoon (3 trials)   Solid   GO   Solid: Not tested       Orinda Kenner, MS, CCC-SLP  Kenzly Rogoff 12/10/2015,3:49 PM

## 2015-12-11 ENCOUNTER — Inpatient Hospital Stay: Payer: Medicare Other

## 2015-12-11 DIAGNOSIS — K5660 Unspecified intestinal obstruction: Secondary | ICD-10-CM | POA: Diagnosis not present

## 2015-12-11 LAB — MULTIPLE MYELOMA PANEL, SERUM
Albumin SerPl Elph-Mcnc: 2.7 g/dL — ABNORMAL LOW (ref 2.9–4.4)
Albumin/Glob SerPl: 1.4 (ref 0.7–1.7)
Alpha 1: 0.3 g/dL (ref 0.0–0.4)
Alpha2 Glob SerPl Elph-Mcnc: 0.6 g/dL (ref 0.4–1.0)
B-GLOBULIN SERPL ELPH-MCNC: 0.8 g/dL (ref 0.7–1.3)
Gamma Glob SerPl Elph-Mcnc: 0.4 g/dL (ref 0.4–1.8)
Globulin, Total: 2 g/dL — ABNORMAL LOW (ref 2.2–3.9)
IGA: 155 mg/dL (ref 61–437)
IGM, SERUM: 32 mg/dL (ref 15–143)
IgG (Immunoglobin G), Serum: 463 mg/dL — ABNORMAL LOW (ref 700–1600)
TOTAL PROTEIN ELP: 4.7 g/dL — AB (ref 6.0–8.5)

## 2015-12-11 LAB — URINE CULTURE

## 2015-12-11 LAB — IMMUNOFIXATION ELECTROPHORESIS
IGA: 152 mg/dL (ref 61–437)
IGG (IMMUNOGLOBIN G), SERUM: 441 mg/dL — AB (ref 700–1600)
IGM, SERUM: 31 mg/dL (ref 15–143)
TOTAL PROTEIN ELP: 4.8 g/dL — AB (ref 6.0–8.5)

## 2015-12-11 MED ORDER — ZOLPIDEM TARTRATE 5 MG PO TABS
5.0000 mg | ORAL_TABLET | Freq: Every evening | ORAL | Status: DC | PRN
Start: 2015-12-11 — End: 2015-12-12
  Filled 2015-12-11: qty 1

## 2015-12-11 NOTE — Progress Notes (Signed)
Physical Therapy Treatment Patient Details Name: Elijah Evans MRN: [id number] DOB: 01-12-1946 Today's Date: 12/11/2015    History of Present Illness presented to ER secondary to abdominal pain,nausea/vomiting; admitted with SOB of  unclear etiology.  Managing conservatively at this time.  Abdominal CT noted with significant bony changes throughout abdomen--question malignancy vs. severe osteoporosis; oncology consult pending.    Assessment Per discussion with PTA after treatment this date, patient remains generally unsafe with mobility, unsafe for return home alone.  Discharge plan updated to reflect recommendations for STR.  Social work informed/aware.  Follow Up Recommendations  SNF     Equipment Recommendations  Rolling walker with 5" wheels    Recommendations for Other Services         PT Goals (current goals can now be found in the care plan section) Acute Rehab PT Goals Patient Stated Goal: "I have to the bathroom"  PT Goal Formulation: With patient/family Time For Goal Achievement: 12/23/15 Potential to Achieve Goals: Good    Frequency  Min 2X/week

## 2015-12-11 NOTE — Progress Notes (Signed)
[place] [facility] [place] NAME: Elijah Evans    MR#:  [id number]  DATE OF BIRTH:  08/31/1946  SUBJECTIVE:  CHIEF COMPLAINT:   Chief Complaint  Patient presents with  . Emesis  . Weakness  . Headache   -Has a couple of bowel movements yesterday. No abdominal pain, no nausea or vomiting. -Feels like the clear liquid diet is giving him diarrhea. -Follow up KUB and also bone scan pending today. -Breathing has improved significantly.  REVIEW OF SYSTEMS:   Constitutional: Negative for fever, chills, weight loss and malaise/fatigue.  HENT: Negative for ear discharge, ear pain, hearing loss and nosebleeds.  Eyes: Negative for blurred vision, double vision and photophobia.  Respiratory: Improved cough, no hemoptysis, shortness of breath and wheezing.  Cardiovascular: Negative for chest pain, palpitations, orthopnea and leg swelling.  Gastrointestinal: No nausea vomiting or abdominal pain. Negative for heartburn, diarrhea, constipation and melena.  Genitourinary: Negative for dysuria, urgency, frequency and hematuria.  Musculoskeletal: Negative for myalgias, back pain and neck pain.  Skin: Negative for rash.   DRUG ALLERGIES:  No Known Allergies  VITALS:  Blood pressure 117/62, pulse 80, temperature 99 F (37.2 C), temperature source Oral, resp. rate 20, height 6' (1.829 m), weight 75.07 kg (165 lb 8 oz), SpO2 92 %.  PHYSICAL EXAMINATION:   GENERAL: 70 y.o.-year-old patient lying in the bed with no acute distress.  EYES: Pupils are unequal, right pupil is slightly enlarged than the left one but both are round, reactive to light and accommodation. No scleral icterus. Extraocular muscles intact.  HEENT: Head atraumatic, normocephalic. Oropharynx and nasopharynx clear. left facial droop is noted. NECK: Supple, no jugular venous distention. No thyroid enlargement, no tenderness.  LUNGS: Normal breath sounds bilaterally, no wheezing,  rales,rhonchi or crepitation. No use of accessory muscles of respiration. Decreased bibasilar breath sounds.  CARDIOVASCULAR: S1, S2 normal. No rubs, or gallops. 3/6 systolic murmur is present ABDOMEN: Soft, nontender, nondistended. Bowel sounds present. No organomegaly or mass.  EXTREMITIES: No pedal edema, cyanosis, or clubbing.  NEUROLOGIC: Cranial nerves II through XII are intact. Muscle strength 5/5 in right extremities and 4/5 left sided strength. Sensation intact. Gait not checked. left facial droop noted. PSYCHIATRIC: The patient is alert and oriented x 3. Slow to respond. SKIN: No obvious rash, lesion, or ulcer.    LABORATORY PANEL:   CBC  Recent Labs Lab 12/10/15 0635  WBC 5.3  HGB 10.8*  HCT 31.5*  PLT 223   ------------------------------------------------------------------------------------------------------------------  Chemistries   Recent Labs Lab 12/08/15 1311 12/09/15 0647 12/10/15 0635  NA 129* 131* 134*  K 3.8 3.2* 3.5  CL 90* 97* 100*  CO2 31 29 30   GLUCOSE 130* 133* 106*  BUN 20 22* 14  CREATININE 1.41* 0.95 0.65  CALCIUM 8.5* 7.9* 7.9*  MG  --  1.7  --   AST 21  --   --   ALT 13*  --   --   ALKPHOS 130*  --   --   BILITOT 0.5  --   --    ------------------------------------------------------------------------------------------------------------------  Cardiac Enzymes No results for input(s): TROPONINI in the last 168 hours. ------------------------------------------------------------------------------------------------------------------  RADIOLOGY:  Ct Chest W Contrast  12/10/2015  CLINICAL DATA:  70 year old smoker with 2 day history of productive cough. Resolving partial small bowel obstruction. Nonspecific mesenteric and retroperitoneal lymphadenopathy on recent CT abdomen and pelvis. EXAM: CT CHEST WITH CONTRAST TECHNIQUE: Multidetector CT imaging of the chest was performed during intravenous  contrast administration. CONTRAST:  64mL  OMNIPAQUE IOHEXOL 300 MG/ML IV. COMPARISON:  No prior chest CT. FINDINGS: Cardiovascular: Heart size normal. Severe 3 vessel coronary atherosclerosis. Mild aortic valvular calcification. No pericardial effusion. Moderate atherosclerosis involving the thoracic aorta without aneurysm or dissection. Bovine aortic arch anatomy (left common carotid artery arises from the innominate artery). Atherosclerosis involving the proximal great vessels with stenoses involving the left common carotid and left subclavian arteries. Mediastinum/Lymph Nodes: No pathologic lymphadenopathy. Mild thickening involving the wall of the distal esophagus, improved since the abdominopelvic CT 2 days ago. Approximate 1.3 cm nodule involving the upper pole of the right lobe of the thyroid gland. Lungs/Pleura: Emphysematous changes diffusely throughout both lungs. Calcified granuloma in the inferior right lower lobe and tiny calcified granuloma in the right lung apex. Subpleural nodule along the lateral aspect of the right major fissure. No significant noncalcified nodule in either lung. Small right pleural effusion and associated atelectasis in the right lower lobe. No left pleural effusion. Calcified tracheobronchial cartilages. Central airways patent with moderate bronchial wall thickening. Upper abdomen: Please see the report of the CT abdomen and pelvis performed 12/08/2015. No new abnormalities involving the visualized upper abdomen. Musculoskeletal: Mottled appearance of the lower cervical and thoracic vertebrae, the sternum, the sternum, and the ribs. No pathologic fractures. IMPRESSION: 1. Small right pleural effusion and associated mild passive atelectasis in the right lower lobe. No acute cardiopulmonary disease otherwise. 2. Severe COPD/emphysema.  Old granulomatous disease. 3. No pathologic lymphadenopathy in the thorax. 4. 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 Bovine aortic arch anatomy  noted. 5. Mottled appearance of the thoracic skeletal structures, similar to that seen in the lumbar spine and pelvis on the prior abdominopelvic CT. Again, the differential diagnosis includes myeloma and aggressive osteoporosis. Electronically Signed   By: Evangeline Dakin M.D.   On: 12/10/2015 19:13   Dg Abd Acute W/chest  12/10/2015  CLINICAL DATA:  Small bowel obstruction, headache, congested lungs on auscultation. EXAM: DG ABDOMEN ACUTE W/ 1V CHEST COMPARISON:  Acute abdominal series of December 08, 2005. FINDINGS: The lungs are well-expanded. The interstitial markings remain mildly increased. There is evidence of previous granulomatous infection. Which is stable. The heart and pulmonary vascularity are normal. There is no pleural effusion. Within the abdomen there are loops of mildly distended gas-filled small bowel in the mid abdomen. There is gas and stool in the colon and rectum. There is gas within the stomach. No free extraluminal gas collections are observed. IMPRESSION: Persistent mild gaseous distention of small-bowel loops to the left of midline with evidence of some distal passage of gas and stool. This may be indicated residual partial distal small bowel obstruction or small bowel ileus. There is no evidence of perforation. Electronically Signed   By: David  Martinique M.D.   On: 12/10/2015 11:39    EKG:   Orders placed or performed during the hospital encounter of 12/08/15  . ED EKG  . ED EKG  . EKG    ASSESSMENT AND PLAN:   #1 Small bowel obstruction-management per surgical team.  -had bowel movements yesterday -Follow-up KUB today.   - On clear liquid diet. Advance diet per surgical team.  #2 lymphadenopathy noted in the abdomen-nonspecific lymphadenopathy and also bone marrow changes noted on CT abdomen and ct chest - Appreciate oncology consult. PSA normal, SPEP with no M spike, Bone scan ordered for today.  #3 history of CVA, hold Aggrenox in case he needs surgery. Continue  statin  for now Also has some dysphagia likely. CT chest done, no aspiration noted. Weaned off oxygen at this time. Breathing is much improved. Appreciate speech consult, currently on clear liquid diet.  #4 hypertension-hold his blood pressure medications for now. Blood pressure within normal limits  #5 hyponatremia-likely from dehydration, hypovolemia. Refused labs today. -On normal saline   #6 seizure disorder- continue phenytoin and Tegretol.  #7 esophagitis noted on CT-likely from recurrent episodes of vomiting on admission. -Continue Protonix twice a day for now  #8 tobacco use disorder-on nicotine patch here  Discussed at length with patient's daughter. Physical therapy consult today   All the records are reviewed and case discussed with Care Management/Social Workerr. Management plans discussed with the patient, daughter and they are in agreement.  CODE STATUS: full code  TOTAL TIME TAKING CARE OF THIS PATIENT: 37 minutes.   POSSIBLE D/C IN 1-2 DAYS, DEPENDING ON CLINICAL CONDITION.   Teague Goynes M.D on 12/11/2015 at 12:31 PM  Between 7am to 6pm - Pager - [phone]  After 6pm go to [email or web address] - password [email or web address] [facility]  [facility]  Office  [phone]  CC: Primary care physician; No primary care provider on file.

## 2015-12-11 NOTE — Care Management (Signed)
Physical therapy re-evaluation completed. Recommending skilled nursing facility. Granddaughter, Estanislado Spire in the room. States that she and her mother Oaklan Serven) would like [facility].  [place] Worker updated. Shelbie Ammons RN MSN CCM Care Management [phone]

## 2015-12-11 NOTE — Progress Notes (Signed)
Pharmacist - Prescriber Communication  Per [facility], the order for diphenhydramine 25 mg po at bedtime PRN sleep has been changed to zolpidem 5 mg for patient > 72 years old. Please call pharmacy if you have any questions about this substitution.   Grizelda Piscopo A. [place], [place].D., BCPS Clinical Pharmacist 12/11/2015 [phone]

## 2015-12-11 NOTE — Clinical Social Work Placement (Signed)
   CLINICAL SOCIAL WORK PLACEMENT  NOTE  Date:  12/11/2015  Patient Details  Name: Jarel Feulner MRN: [id number] Date of Birth: Apr 27, 1946  Clinical Social Work is seeking post-discharge placement for this patient at the   level of care (*CSW will initial, date and re-position this form in  chart as items are completed):  Yes   Patient/family provided with [place] Work Department's list of facilities offering this level of care within the geographic area requested by the patient (or if unable, by the patient's family).  Yes   Patient/family informed of their freedom to choose among providers that offer the needed level of care, that participate in Medicare, Medicaid or managed care program needed by the patient, have an available bed and are willing to accept the patient.  Yes   Patient/family informed of [facility]'s ownership interest in [facility] and [facility], as well as of the fact that they are under no obligation to receive care at these facilities.  PASRR submitted to EDS on 12/11/15     PASRR number received on 12/11/15     Existing PASRR number confirmed on       FL2 transmitted to all facilities in geographic area requested by pt/family on 12/11/15     FL2 transmitted to all facilities within larger geographic area on       Patient informed that his/her managed care company has contracts with or will negotiate with certain facilities, including the following:        Yes   Patient/family informed of bed offers received.  Patient [facility] bed at [facility]     Physician recommends and patient chooses bed at      Patient to be transferred to   on  .  Patient to be transferred to facility by       Patient family notified on   of transfer.  Name of family member notified:        PHYSICIAN       Additional Comment:    _______________________________________________ Darden Dates, LCSW 12/11/2015, 3:46 PM

## 2015-12-11 NOTE — Progress Notes (Signed)
Elijah Evans   DOB:06-29-1946   YK#:[id number]    Subjective: Patient's diet is being advanced. He denies any abdominal pain. He is passing gas. NG tube is out.denies any nausea vomiting. Denies any worsening back pain.  Objective:  Filed Vitals:   12/10/15 2126 12/11/15 0511  BP: 120/57 117/62  Pulse: 80 80  Temp: 99.1 F (37.3 C) 99 F (37.2 C)  Resp:  20     Intake/Output Summary (Last 24 hours) at 12/11/15 7939 Last data filed at 12/11/15 0300  Gross per 24 hour  Intake   4532 ml  Output    550 ml  Net   3982 ml    GENERAL: Well-nourished; Alert, no distress and comfortable. He is alone. Sitting in the bedr. EYES: no pallor or icterus OROPHARYNX: no thrush or ulceration; poor dentition  NECK: supple, no masses felt LYMPH: no palpable lymphadenopathy in the cervical, axillary or inguinal regions LUNGS: Decreased breath sounds bilaterally.and No wheeze or crackles HEART/CVS: regular rate & rhythm and no murmurs; No lower extremity edema ABDOMEN: abdomen soft, non-tender and normal bowel sounds; Distended. Musculoskeletal:no cyanosis of digits and no clubbing  PSYCH: alert & oriented x 3 with fluent speech NEURO: no focal motor/sensory deficits SKIN: no rashes or significant lesions   Labs:  Lab Results  Component Value Date   WBC 5.3 12/10/2015   HGB 10.8* 12/10/2015   HCT 31.5* 12/10/2015   MCV 96.0 12/10/2015   PLT 223 12/10/2015   NEUTROABS 11.9* 12/08/2015    Lab Results  Component Value Date   NA 134* 12/10/2015   K 3.5 12/10/2015   CL 100* 12/10/2015   CO2 30 12/10/2015    Studies:  Ct Chest W Contrast  12/10/2015  CLINICAL DATA:  70 year old smoker with 2 day history of productive cough. Resolving partial small bowel obstruction. Nonspecific mesenteric and retroperitoneal lymphadenopathy on recent CT abdomen and pelvis. EXAM: CT CHEST WITH CONTRAST TECHNIQUE: Multidetector CT imaging of the chest was performed during intravenous contrast  administration. CONTRAST:  38m OMNIPAQUE IOHEXOL 300 MG/ML IV. COMPARISON:  No prior chest CT. FINDINGS: Cardiovascular: Heart size normal. Severe 3 vessel coronary atherosclerosis. Mild aortic valvular calcification. No pericardial effusion. Moderate atherosclerosis involving the thoracic aorta without aneurysm or dissection. Bovine aortic arch anatomy (left common carotid artery arises from the innominate artery). Atherosclerosis involving the proximal great vessels with stenoses involving the left common carotid and left subclavian arteries. Mediastinum/Lymph Nodes: No pathologic lymphadenopathy. Mild thickening involving the wall of the distal esophagus, improved since the abdominopelvic CT 2 days ago. Approximate 1.3 cm nodule involving the upper pole of the right lobe of the thyroid gland. Lungs/Pleura: Emphysematous changes diffusely throughout both lungs. Calcified granuloma in the inferior right lower lobe and tiny calcified granuloma in the right lung apex. Subpleural nodule along the lateral aspect of the right major fissure. No significant noncalcified nodule in either lung. Small right pleural effusion and associated atelectasis in the right lower lobe. No left pleural effusion. Calcified tracheobronchial cartilages. Central airways patent with moderate bronchial wall thickening. Upper abdomen: Please see the report of the CT abdomen and pelvis performed 12/08/2015. No new abnormalities involving the visualized upper abdomen. Musculoskeletal: Mottled appearance of the lower cervical and thoracic vertebrae, the sternum, the sternum, and the ribs. No pathologic fractures. IMPRESSION: 1. Small right pleural effusion and associated mild passive atelectasis in the right lower lobe. No acute cardiopulmonary disease otherwise. 2. Severe COPD/emphysema.  Old granulomatous disease. 3. No pathologic lymphadenopathy in the  thorax. 4. Stenoses involving the proximal left common carotid and left subclavian  arteries. Bovine aortic arch anatomy noted. 5. Mottled appearance of the thoracic skeletal structures, similar to that seen in the lumbar spine and pelvis on the prior abdominopelvic CT. Again, the differential diagnosis includes myeloma and aggressive osteoporosis. Electronically Signed   By: Evangeline Dakin M.D.   On: 12/10/2015 19:13   Dg Abd Acute W/chest  12/10/2015  CLINICAL DATA:  Small bowel obstruction, headache, congested lungs on auscultation. EXAM: DG ABDOMEN ACUTE W/ 1V CHEST COMPARISON:  Acute abdominal series of December 08, 2005. FINDINGS: The lungs are well-expanded. The interstitial markings remain mildly increased. There is evidence of previous granulomatous infection. Which is stable. The heart and pulmonary vascularity are normal. There is no pleural effusion. Within the abdomen there are loops of mildly distended gas-filled small bowel in the mid abdomen. There is gas and stool in the colon and rectum. There is gas within the stomach. No free extraluminal gas collections are observed. IMPRESSION: Persistent mild gaseous distention of small-bowel loops to the left of midline with evidence of some distal passage of gas and stool. This may be indicated residual partial distal small bowel obstruction or small bowel ileus. There is no evidence of perforation. Electronically Signed   By: David  Martinique M.D.   On: 12/10/2015 11:39   Dg Abd Acute W/chest  12/09/2015  CLINICAL DATA:  Worsening nausea and vomiting, abdominal distension, small bowel obstruction EXAM: DG ABDOMEN ACUTE W/ 1V CHEST COMPARISON:  CT scan 12/08/2015 FINDINGS: Cardiomediastinal silhouette is unremarkable. No acute infiltrate or pulmonary edema. NG tube with tip in proximal stomach. Residual mild gaseous distended small bowel loops in mid abdomen with improvement from prior exam. Probable residual ileus or improving bowel obstruction. There is moderate gas noted within right colon and transverse colon. Contrast material from  yesterday CT scan noted within urinary bladder. IMPRESSION: NG tube with tip in proximal stomach. Residual mild gaseous distended small bowel loops in mid abdomen with improvement from prior exam. Probable residual ileus or improving bowel obstruction. There is moderate gas noted within right colon and transverse colon. Contrast material from yesterday CT scan noted within urinary bladder. Electronically Signed   By: Lahoma Crocker M.D.   On: 12/09/2015 09:33    Assessment & Plan:   70 year old male patient currently admitted to the hospital for Small bowel obstruction conservatively managed.  # Incidental mottled appearance of the bone [chest CT/abdomen pelvis CT]- severe osteoporosis versus malignancy-serum immunoelectrophoresis is pending; however kappa lambda light chain ratio is normal. Clinically I suspect severe osteoporosis rather than true multiple myeloma. I will also order skeletal survey to complete the workup of multiple myeloma.   # Small bowel obstruction-improving/ Long-standing history of smoking/Acute renal failure- resolved.    Cammie Sickle, MD 12/11/2015  8:22 AM

## 2015-12-11 NOTE — NC FL2 (Signed)
[place] LEVEL OF CARE SCREENING TOOL     IDENTIFICATION  Patient Name: Elijah Evans Birthdate: 1946-03-22 Sex: male Admission Date (Current Location): 12/08/2015  [place] and [place] Number:  Engineering geologist and Address:  [facility], [address] [place], [place] [address]      Provider Number: [id number]  Attending Physician Name and Address:  Jules Husbands, MD  Relative Name and Phone Number:       Current Level of Care: Hospital Recommended Level of Care: [place] Prior Approval Number:    Date Approved/Denied:   PASRR Number: [id number]  Discharge Plan: SNF    Current Diagnoses: Patient Active Problem List   Diagnosis Date Noted  . SBO (small bowel obstruction) ([place])   . Small bowel obstruction (H[facility]) 12/08/2015    Orientation RESPIRATION BLADDER Height & Weight     Self  Normal Incontinent Weight: 165 lb 8 oz (75.07 kg) Height:  6' (182.9 cm)  BEHAVIORAL SYMPTOMS/MOOD NEUROLOGICAL BOWEL NUTRITION STATUS      Continent Diet (Regular Diet)  AMBULATORY STATUS COMMUNICATION OF NEEDS Skin   Limited Assist Verbally Normal                       Personal Care Assistance Level of Assistance  Bathing, Feeding, Dressing Bathing Assistance: Limited assistance Feeding assistance: Limited assistance Dressing Assistance: Limited assistance     Functional Limitations Info  Sight, Hearing, Speech Sight Info: Adequate Hearing Info: Impaired Speech Info: Adequate    SPECIAL CARE FACTORS FREQUENCY                       Contractures      Additional Factors Info  Code Status, Allergies Code Status Info: Full Code Allergies Info: No known allergies           Current Medications (12/11/2015):  This is the current hospital active medication list Current Facility-Administered Medications  Medication Dose Route Frequency Provider Last Rate Last Dose  . 0.9 %  sodium chloride  infusion   Intravenous Continuous Gladstone Lighter, MD 75 mL/hr at 12/11/15 0532    . acetaminophen (TYLENOL) tablet 650 mg  650 mg Oral Q6H PRN Gladstone Lighter, MD      . carbamazepine (TEGRETOL) tablet 400 mg  400 mg Oral BID Gladstone Lighter, MD   400 mg at 12/11/15 1135  . heparin injection 5,000 Units  5,000 Units Subcutaneous 3 times per day Jules Husbands, MD   5,000 Units at 12/11/15 0532  . morphine 2 MG/ML injection 2 mg  2 mg Intravenous Q2H PRN Jules Husbands, MD   2 mg at 12/09/15 1607  . nicotine (NICODERM CQ - dosed in mg/24 hours) patch 14 mg  14 mg Transdermal Daily Jules Husbands, MD   14 mg at 12/11/15 1135  . ondansetron (ZOFRAN-ODT) disintegrating tablet 4 mg  4 mg Oral Q6H PRN Jules Husbands, MD       Or  . ondansetron [facility]) injection 4 mg  4 mg Intravenous Q6H PRN Jules Husbands, MD   4 mg at 12/08/15 2022  . pantoprazole (PROTONIX) injection 40 mg  40 mg Intravenous Q12H Gladstone Lighter, MD   40 mg at 12/10/15 2140  . phenytoin (DILANTIN) ER capsule 200 mg  200 mg Oral QHS Gladstone Lighter, MD   200 mg at 12/10/15 2141  . phenytoin (DILANTIN) ER capsule 60 mg  60 mg  Oral BID Gladstone Lighter, MD   60 mg at 12/11/15 1135  . simvastatin (ZOCOR) tablet 40 mg  40 mg Oral QHS Gladstone Lighter, MD   40 mg at 12/10/15 2141  . zolpidem (AMBIEN) tablet 5 mg  5 mg Oral QHS PRN Lance Coon, MD         Discharge Medications: Please see discharge summary for a list of discharge medications.  Relevant Imaging Results:  Relevant Lab Results:   Additional Information SSN:  [id number]  Darden Dates, LCSW

## 2015-12-11 NOTE — Progress Notes (Signed)
Physical Therapy Treatment Patient Details Name: Elijah Evans MRN: [id number] DOB: [birth year] Today's Date: 12/11/2015    History of Present Illness presented to ER secondary to abdominal pain,nausea/vomiting; admitted with SOB of  unclear etiology.  Managing conservatively at this time.  Abdominal CT noted with significant bony changes throughout abdomen--question malignancy vs. severe osteoporosis; oncology consult pending.    PT Comments    Pt demonstrated increased mobility tolerance requiring decreased assistance for transfers and continues to demonstrate poor safety awareness with mobility.  Discussed discharge plans with family as family requesting SNF.  As pt continues to demonstrate impulsiveness with transfers and gait and decreased activity tolerance PTA discussed with LSW to refer to SNR as per family's request.   Follow Up Recommendations  Home health PT     Equipment Recommendations  Rolling walker with 5" wheels    Recommendations for Other Services       Precautions / Restrictions Precautions Precautions: Fall Precaution Comments: NPO, NG tube to continuous suction Restrictions Weight Bearing Restrictions: No    Mobility  Bed Mobility Overal bed mobility: Modified Independent                Transfers Overall transfer level: Needs assistance Equipment used: Rolling walker (2 wheeled) Transfers: Sit to/from Stand Sit to Stand: Min guard         General transfer comment: poor safety awareness  Ambulation/Gait Ambulation/Gait assistance: Modified independent (Device/Increase time) Ambulation Distance (Feet): 12 Feet Assistive device: Rolling walker (2 wheeled)       General Gait Details: decreased step length, poor safety over threshold   Stairs            Wheelchair Mobility    Modified Rankin (Stroke Patients Only)       Balance Overall balance assessment: Needs assistance Sitting-balance support: No upper extremity  supported Sitting balance-Leahy Scale: Good     Standing balance support: Bilateral upper extremity supported Standing balance-Leahy Scale: Fair                      Cognition Arousal/Alertness: Awake/alert Behavior During Therapy: WFL for tasks assessed/performed Overall Cognitive Status: History of cognitive impairments - at baseline       Memory: Decreased short-term memory              Exercises      General Comments        Pertinent Vitals/Pain Pain Assessment: No/denies pain    Home Living                      Prior Function            PT Goals (current goals can now be found in the care plan section) Acute Rehab PT Goals Patient Stated Goal: "I have to the bathroom"  PT Goal Formulation: With patient/family Time For Goal Achievement: 12/23/15 Potential to Achieve Goals: Good    Frequency  Min 2X/week    PT Plan      Co-evaluation             End of Session Equipment Utilized During Treatment: Gait belt Activity Tolerance: Patient tolerated treatment well Patient left: in bed;with call bell/phone within reach;with bed alarm set;with family/visitor present     Time: 1400-1420 PT Time Calculation (min) (ACUTE ONLY): 20 min  Charges:  $Therapeutic Activity: 8-22 mins                    G  Codes:      Katheen Aslin 12/11/2015, 2:35 PM  Mr.  Phylliss Bob, PTA

## 2015-12-11 NOTE — Progress Notes (Signed)
71 yr old partial small bowel,now resolved.  Patient tolerate regular diet well, having BMs.  He did work with PT today who recommended SNF.   Filed Vitals:   12/10/15 2126 12/11/15 0511  BP: 120/57 117/62  Pulse: 80 80  Temp: 99.1 F (37.3 C) 99 F (37.2 C)  Resp:  20   I/O last 3 completed shifts: In: 4532 [P.O.:1300; I.V.:3232] Out: 1000 [Urine:1000] Total I/O In: 240 [P.O.:240] Out: -    PE:  GEN: NAD ABD: soft, non-tender, non distended Ext: 2+ pulses soft.   CBC Latest Ref Rng 12/10/2015 12/09/2015 12/08/2015  WBC 3.8 - 10.6 K/uL 5.3 10.2 13.0(H)  Hemoglobin 13.0 - 18.0 g/dL 10.8(L) 11.5(L) 13.1  Hematocrit 40.0 - 52.0 % 31.5(L) 33.6(L) 38.7(L)  Platelets 150 - 440 K/uL 223 262 326   CMP Latest Ref Rng 12/10/2015 12/09/2015 12/08/2015  Glucose 65 - 99 mg/dL 106(H) 133(H) 130(H)  BUN 6 - 20 mg/dL 14 22(H) 20  Creatinine 0.61 - 1.24 mg/dL 0.65 0.95 1.41(H)  Sodium 135 - 145 mmol/L 134(L) 131(L) 129(L)  Potassium 3.5 - 5.1 mmol/L 3.5 3.2(L) 3.8  Chloride 101 - 111 mmol/L 100(L) 97(L) 90(L)  CO2 22 - 32 mmol/L 30 29 31   Calcium 8.9 - 10.3 mg/dL 7.9(L) 7.9(L) 8.5(L)  Total Protein 6.5 - 8.1 g/dL - - 6.5  Total Bilirubin 0.3 - 1.2 mg/dL - - 0.5  Alkaline Phos 38 - 126 U/L - - 130(H)  AST 15 - 41 U/L - - 21  ALT 17 - 63 U/L - - 13(L)    A/P:  Partial small bowel obstruction, concern for possible myeloma?; now resolved  Advanced to regular diet Appreciate help from hospitalist and oncology.   PT recommending SNF placement, he is ok to go from a medical standpoint, we will work on transferring him tomorrow if a bed is available.

## 2015-12-11 NOTE — Clinical Social Work Note (Signed)
Clinical Social Work Assessment  Patient Details  Name: Elijah Evans MRN: [id number] Date of Birth: 13-Dec-1945  Date of referral:  12/11/15               Reason for consult:  Facility Placement                Permission sought to share information with:  Guardian Permission granted to share information::  Yes, Verbal Permission Granted  Name::     Tyan Mcclenton, daughter and legal guardian.    Housing/Transportation Living arrangements for the past 2 months:  Single Family Home Source of Information:  Guardian Patient Interpreter Needed:  None Criminal Activity/Legal Involvement Pertinent to Current Situation/Hospitalization:  No - Comment as needed Significant Relationships:  Adult Children, Other Family Members Lives with:  Adult Children Do you feel safe going back to the place where you live?  Yes Need for family participation in patient care:  Yes (Comment)  Care giving concerns:  No care giving needs identified.    Social Worker assessment / plan:  CSW spoke with pt's guardian, daughter [name], to address consult. CSW introduced herself and explained role of social work. PT is recommending STR at SNF at discharge. CSW also explained the process of discharging to SNF with Medicare/Medicaid. Pt's family is also interested in LTC after pt completes rehab. Pt's daughter stated that pt had a room at [facility], however CSW sent referral to facility and facility is able to meet pt's needs medically. CSW confirmed bed offer. Pt will discharge to [facility] for STR when medically ready. CSW will continue to follow.   Employment status:  Retired Forensic scientist:  Information systems manager, Medicaid In [place] PT Recommendations:  [place] / Referral to community resources:  Register  Patient/Family's Response to care:  Pt's guardian was appreciative of CSW support.   Patient/Family's Understanding of and Emotional Response to  Diagnosis, Current Treatment, and Prognosis:  Pt's guardian is aware that pt would benefit from [place] however LTC will be assessed at time of completion of rehab.   Emotional Assessment Appearance:   Stated age Attitude/Demeanor/Rapport:  Other Affect (typically observed):  Other Orientation:  Oriented to Self Alcohol / Substance use:  Never Used Psych involvement (Current and /or in the community):  No (Comment)  Discharge Needs  Concerns to be addressed:  Adjustment to Illness Readmission within the last 30 days:    Current discharge risk:  None Barriers to Discharge:  Continued Medical Work up   [employer], LCSW 12/11/2015, 3:56 PM

## 2015-12-12 LAB — BASIC METABOLIC PANEL
ANION GAP: 5 (ref 5–15)
BUN: 11 mg/dL (ref 6–20)
CHLORIDE: 103 mmol/L (ref 101–111)
CO2: 27 mmol/L (ref 22–32)
CREATININE: 0.63 mg/dL (ref 0.61–1.24)
Calcium: 7.5 mg/dL — ABNORMAL LOW (ref 8.9–10.3)
GFR calc non Af Amer: >60 mL/min (ref >60)
Glucose, Bld: 93 mg/dL (ref 65–99)
Potassium: 3.4 mmol/L — ABNORMAL LOW (ref 3.5–5.1)
Sodium: 135 mmol/L (ref 135–145)

## 2015-12-12 MED ORDER — ACETAMINOPHEN 325 MG PO TABS: 650.0000 mg | ORAL_TABLET | ORAL | Status: AC | PRN

## 2015-12-12 NOTE — Progress Notes (Signed)
Patient has orders for d/c to SNF on 12/12/2015. Dr Hubbard Robinson completed and signed d/c summary and order. All d/c information faxed to [facility] and confirmed. SW verified with Broadus John that all documents were received. Broadus John provided SW with contact number and room number. SW completed EMS packet and placed hard scripts in packet. SW notified RN Judson Roch at extension [phone] that packet was ready and placed next to patients chart. Nurse was provided Rm # [id number] and point of contact Mariann Laster) at facility, contact # for report is [phone]. Nurse informed SW that she notified daughter of pt d/c to [facility]. No further identified needs.   Anitra Lauth, BSW, MSW, [employer]

## 2015-12-12 NOTE — Discharge Summary (Signed)
Physician Discharge Summary  Patient ID: Elijah Evans MRN: [id number] DOB/AGE: [birth year] 70 y.o.  Admit date: 12/08/2015 Discharge date: 12/12/2015  Admission Diagnoses: Ileus verse small bowel obstruction  Discharge Diagnoses:  Partial Small bowel obstruction-resolved Esophagitis  Discharged Condition: good  Hospital Course: 70 yr old male with nausea and vomiting with multiple medical issues, had CT scan with multiple dilated loops of small bowel, no clear transition point and lymphadenopathy, as well as some esophageal thickening.  He had work up by oncology to r/o myeloma and bone scan negative.  His medical management was done by the hospitalist team. He is eating a regular diet and having BMs.  He was evaluated by physical therapy who felt he was appropriate for SNF placement. He should have f/u with GI for EGD and colonoscopy to r/o cancer given the findings on CT scan, no records of previous scopes, will set up to see Dr. Allen Norris.  He does not need any surgical follow up at this time.  Will have him f/u with his PCP.   Consults: Medicine and Oncology  Significant Diagnostic Studies: CT scan  Treatments: IV hydration  Discharge Exam: Blood pressure 123/59, pulse 75, temperature 98.6 F (37 C), temperature source Oral, resp. rate 18, height 6' (1.829 m), weight 165 lb 8 oz (75.07 kg), SpO2 98 %. General appearance: alert and cooperative GI: soft, non-tender; bowel sounds normal; no masses,  no organomegaly Extremities: extremities normal, atraumatic, no cyanosis or edema  Disposition: Final discharge disposition not confirmed  Discharge Instructions    Activity as tolerated - No restrictions    Complete by:  As directed      Diet - low sodium heart healthy    Complete by:  As directed      Increase activity slowly    Complete by:  As directed             Medication List    TAKE these medications        acetaminophen 325 MG tablet  Commonly known as:  TYLENOL   Take 2 tablets (650 mg total) by mouth every 4 (four) hours as needed for mild pain, moderate pain or headache.     carbamazepine 200 MG tablet  Commonly known as:  TEGRETOL  Take 400 mg by mouth 2 (two) times daily.     dipyridamole-aspirin 200-25 MG 12hr capsule  Commonly known as:  AGGRENOX  Take 1 capsule by mouth 2 (two) times daily.     losartan-hydrochlorothiazide 100-25 MG tablet  Commonly known as:  HYZAAR  Take 1 tablet by mouth daily.     phenytoin 100 MG ER capsule  Commonly known as:  DILANTIN  Take 200 mg by mouth at bedtime.     phenytoin 30 MG ER capsule  Commonly known as:  DILANTIN  Take 60 mg by mouth 2 (two) times daily.     simvastatin 40 MG tablet  Commonly known as:  ZOCOR  Take 40 mg by mouth at bedtime.           Follow-up Information    Follow up with Ollen Bowl, MD. Schedule an appointment as soon as possible for a visit in 2 weeks.   Specialty:  Gastroenterology   Why:  will need EGD and colonoscopy to evaluate for Esophagitits and ?cancer on CT   Contact information:   [address] [place] [address] [phone]       Follow up with Mendel Ryder, MD. Schedule an  appointment as soon as possible for a visit in 1 week.   Specialty:  Family Medicine   Contact information:   [place] [address] [phone]       Signed: Hubbard Robinson 12/12/2015, 10:06 AM

## 2015-12-12 NOTE — Progress Notes (Signed)
[place] [facility] [place] NAME: Elijah Evans    MR#:  [id number]  DATE OF BIRTH:  1946-07-05  SUBJECTIVE:  CHIEF COMPLAINT:   Chief Complaint  Patient presents with  . Emesis  . Weakness  . Headache   -having bowel movements and tolerating diet well - possible discharge today- to rehab - Bone scan done with no myeloma findings  REVIEW OF SYSTEMS:   Constitutional: Negative for fever, chills, weight loss and malaise/fatigue.  HENT: Negative for ear discharge, ear pain, hearing loss and nosebleeds.  Eyes: Negative for blurred vision, double vision and photophobia.  Respiratory: Improved cough, no hemoptysis, shortness of breath and wheezing.  Cardiovascular: Negative for chest pain, palpitations, orthopnea and leg swelling.  Gastrointestinal: No nausea vomiting or abdominal pain. Negative for heartburn, diarrhea, constipation and melena.  Genitourinary: Negative for dysuria, urgency, frequency and hematuria.  Musculoskeletal: Negative for myalgias, back pain and neck pain.  Skin: Negative for rash.   DRUG ALLERGIES:  No Known Allergies  VITALS:  Blood pressure 123/59, pulse 75, temperature 98.6 F (37 C), temperature source Oral, resp. rate 18, height 6' (1.829 m), weight 75.07 kg (165 lb 8 oz), SpO2 98 %.  PHYSICAL EXAMINATION:   GENERAL: 70 y.o.-year-old patient lying in the bed with no acute distress.  EYES: Pupils are unequal, right pupil is slightly enlarged than the left one but both are round, reactive to light and accommodation. No scleral icterus. Extraocular muscles intact.  HEENT: Head atraumatic, normocephalic. Oropharynx and nasopharynx clear. left facial droop is noted. NECK: Supple, no jugular venous distention. No thyroid enlargement, no tenderness.  LUNGS: Normal breath sounds bilaterally, no wheezing, rales,rhonchi or crepitation. No use of accessory muscles of respiration. Decreased bibasilar breath  sounds.  CARDIOVASCULAR: S1, S2 normal. No rubs, or gallops. 3/6 systolic murmur is present ABDOMEN: Soft, nontender, nondistended. Bowel sounds present. No organomegaly or mass.  EXTREMITIES: No pedal edema, cyanosis, or clubbing.  NEUROLOGIC: Cranial nerves II through XII are intact. Muscle strength 5/5 in right extremities and 4/5 left sided strength. Sensation intact. Gait not checked. left facial droop noted. PSYCHIATRIC: The patient is alert and oriented x 3. Slow to respond. SKIN: No obvious rash, lesion, or ulcer.    LABORATORY PANEL:   CBC  Recent Labs Lab 12/10/15 0635  WBC 5.3  HGB 10.8*  HCT 31.5*  PLT 223   ------------------------------------------------------------------------------------------------------------------  Chemistries   Recent Labs Lab 12/08/15 1311 12/09/15 0647  12/12/15 0520  NA 129* 131*  < > 135  K 3.8 3.2*  < > 3.4*  CL 90* 97*  < > 103  CO2 31 29  < > 27  GLUCOSE 130* 133*  < > 93  BUN 20 22*  < > 11  CREATININE 1.41* 0.95  < > 0.63  CALCIUM 8.5* 7.9*  < > 7.5*  MG  --  1.7  --   --   AST 21  --   --   --   ALT 13*  --   --   --   ALKPHOS 130*  --   --   --   BILITOT 0.5  --   --   --   < > = values in this interval not displayed. ------------------------------------------------------------------------------------------------------------------  Cardiac Enzymes No results for input(s): TROPONINI in the last 168 hours. ------------------------------------------------------------------------------------------------------------------  RADIOLOGY:  Dg Abd 1 View  12/11/2015  CLINICAL DATA:  Small bowel obstruction EXAM: ABDOMEN - 1 VIEW COMPARISON:  12/10/2015 FINDINGS: Single upright abdomen view. No free intraperitoneal gas. Dilated small bowel loops persist across the abdomen. No pneumatosis. IMPRESSION: Stable dilated small-bowel loops. No significant change. No free intraperitoneal gas. Electronically Signed   By: Marybelle Killings  M.D.   On: 12/11/2015 13:22   Ct Chest W Contrast  12/10/2015  CLINICAL DATA:  70 year old smoker with 2 day history of productive cough. Resolving partial small bowel obstruction. Nonspecific mesenteric and retroperitoneal lymphadenopathy on recent CT abdomen and pelvis. EXAM: CT CHEST WITH CONTRAST TECHNIQUE: Multidetector CT imaging of the chest was performed during intravenous contrast administration. CONTRAST:  50m OMNIPAQUE IOHEXOL 300 MG/ML IV. COMPARISON:  No prior chest CT. FINDINGS: Cardiovascular: Heart size normal. Severe 3 vessel coronary atherosclerosis. Mild aortic valvular calcification. No pericardial effusion. Moderate atherosclerosis involving the thoracic aorta without aneurysm or dissection. Bovine aortic arch anatomy (left common carotid artery arises from the innominate artery). Atherosclerosis involving the proximal great vessels with stenoses involving the left common carotid and left subclavian arteries. Mediastinum/Lymph Nodes: No pathologic lymphadenopathy. Mild thickening involving the wall of the distal esophagus, improved since the abdominopelvic CT 2 days ago. Approximate 1.3 cm nodule involving the upper pole of the right lobe of the thyroid gland. Lungs/Pleura: Emphysematous changes diffusely throughout both lungs. Calcified granuloma in the inferior right lower lobe and tiny calcified granuloma in the right lung apex. Subpleural nodule along the lateral aspect of the right major fissure. No significant noncalcified nodule in either lung. Small right pleural effusion and associated atelectasis in the right lower lobe. No left pleural effusion. Calcified tracheobronchial cartilages. Central airways patent with moderate bronchial wall thickening. Upper abdomen: Please see the report of the CT abdomen and pelvis performed 12/08/2015. No new abnormalities involving the visualized upper abdomen. Musculoskeletal: Mottled appearance of the lower cervical and thoracic vertebrae, the  sternum, the sternum, and the ribs. No pathologic fractures. IMPRESSION: 1. Small right pleural effusion and associated mild passive atelectasis in the right lower lobe. No acute cardiopulmonary disease otherwise. 2. Severe COPD/emphysema.  Old granulomatous disease. 3. No pathologic lymphadenopathy in the thorax. 4. 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 Bovine aortic arch anatomy noted. 5. Mottled appearance of the thoracic skeletal structures, similar to that seen in the lumbar spine and pelvis on the prior abdominopelvic CT. Again, the differential diagnosis includes myeloma and aggressive osteoporosis. Electronically Signed   By: TEvangeline DakinM.D.   On: 12/10/2015 19:13   Dg Abd Acute W/chest  12/10/2015  CLINICAL DATA:  Small bowel obstruction, headache, congested lungs on auscultation. EXAM: DG ABDOMEN ACUTE W/ 1V CHEST COMPARISON:  Acute abdominal series of December 08, 2005. FINDINGS: The lungs are well-expanded. The interstitial markings remain mildly increased. There is evidence of previous granulomatous infection. Which is stable. The heart and pulmonary vascularity are normal. There is no pleural effusion. Within the abdomen there are loops of mildly distended gas-filled small bowel in the mid abdomen. There is gas and stool in the colon and rectum. There is gas within the stomach. No free extraluminal gas collections are observed. IMPRESSION: Persistent mild gaseous distention of small-bowel loops to the left of midline with evidence of some distal passage of gas and stool. This may be indicated residual partial distal small bowel obstruction or small bowel ileus. There is no evidence of perforation. Electronically Signed   By: David  JMartiniqueM.D.   On: 12/10/2015 11:39   Dg Bone Survey Met  12/11/2015  CLINICAL DATA:  Multiple myeloma.  EXAM: METASTATIC BONE SURVEY COMPARISON:  None FINDINGS: There is than age indeterminate, nondisplaced fracture  involving the distal aspect of the right tibia is suspected. No cortical step-off is identified. The overlying cortex appears thickened. There is degenerative changes noted within the cervical, thoracic and lumbar spine. Aortic atherosclerosis. No lesions of myeloma identified. IMPRESSION: 1. Distal right tibial fracture is likely chronic. Clinical correlation advise. 2. Cervical, thoracic an lumbar spondylosis. 3. No lesions of myeloma. Electronically Signed   By: Kerby Moors M.D.   On: 12/11/2015 13:13    EKG:   Orders placed or performed during the hospital encounter of 12/08/15  . ED EKG  . ED EKG  . EKG    ASSESSMENT AND PLAN:   #1 Small bowel obstruction-management per surgical team.  -had bowel movements  -Started on a regular diet and tolerating well today. -Possible discharge today  #2 lymphadenopathy noted in the abdomen-nonspecific lymphadenopathy and also bone marrow changes noted on CT abdomen and ct chest - Appreciate oncology consult. PSA normal, SPEP with no M spike, Bone scan with osteoporosis but no myeloma findings. -Outpatient follow-up with GI for colonoscopy recommended  #3 history of CVA, restart aggrenox at discharge Continue statin for now Also has some dysphagia from CVA. CT chest done, no aspiration noted. Weaned off oxygen at this time. Breathing is much improved. Appreciate speech consult, currently on regular diet.  #4 hypertension-hold his blood pressure medications for now. Blood pressure within normal limits  #5 hyponatremia-likely from dehydration, hypovolemia. Normalised sodium -On normal saline   #6 seizure disorder- continue phenytoin and Tegretol.  #7 esophagitis noted on CT-likely from recurrent episodes of vomiting on admission. -Continue Protonix twice a day for now  #8 tobacco use disorder-on nicotine patch here  Physical therapy recommended rehabilitation. Patient will be discharged to [facility] today. Medically stable, we'll  sign off at this time. Outpatient follow-up recommended   All the records are reviewed and case discussed with Care Management/Social Workerr. Management plans discussed with the patient, daughter and they are in agreement.  CODE STATUS: full code  TOTAL TIME TAKING CARE OF THIS PATIENT: 37 minutes.   POSSIBLE D/C TODAY, DEPENDING ON CLINICAL CONDITION.   Gladstone Lighter M.D on 12/12/2015 at 9:22 AM  Between 7am to 6pm - Pager - [phone]  After 6pm go to [email or web address] - password [email or web address] [facility]  [place] [facility]  Office  [phone]  CC: Primary care physician; No primary care provider on file.

## 2015-12-12 NOTE — Clinical Social Work Placement (Signed)
   CLINICAL SOCIAL WORK PLACEMENT  NOTE  Date:  12/12/2015  Patient Details  Name: Elijah Evans MRN: [id number] Date of Birth: 22-Oct-1946  Clinical Social Work is seeking post-discharge placement for this patient at the   level of care (*CSW will initial, date and re-position this form in  chart as items are completed):  Yes   Patient/family provided with [place] Work Department's list of facilities offering this level of care within the geographic area requested by the patient (or if unable, by the patient's family).  Yes   Patient/family informed of their freedom to choose among providers that offer the needed level of care, that participate in Medicare, Medicaid or managed care program needed by the patient, have an available bed and are willing to accept the patient.  Yes   Patient/family informed of [place]'s ownership interest in [facility] and [facility], as well as of the fact that they are under no obligation to receive care at these facilities.  PASRR submitted to EDS on 12/11/15     PASRR number received on 12/11/15     Existing PASRR number confirmed on       FL2 transmitted to all facilities in geographic area requested by pt/family on 12/11/15     FL2 transmitted to all facilities within larger geographic area on       Patient informed that his/her managed care company has contracts with or will negotiate with certain facilities, including the following:        Yes   Patient/family informed of bed offers received.  Patient chooses bed at [facility]     Physician recommends and patient chooses bed at      Patient to be transferred to  ([facility]) on 12/12/15.  Patient to be transferred to facility by  (EMS)     Patient family notified on 12/12/15 of transfer.  Name of family member notified:  Michelle-Daughter     PHYSICIAN       Additional Comment:     _______________________________________________ Micah Flesher, LCSW 12/12/2015, 11:36 AM

## 2015-12-12 NOTE — Progress Notes (Addendum)
Pt being discharged to [facility]. Patient is in no acute distress at this time, and assessment is unchanged from this morning. Patient's IV is out, report has been called, and patient/family are agreeable to transfer. There are no questions or concerns at this time. Patient will be accompanied downstairs by staff and family via wheelchair.

## 2015-12-12 NOTE — Plan of Care (Signed)
Problem: Pain Managment: Goal: General experience of comfort will improve Outcome: Progressing No complaints of pain.    

## 2015-12-15 ENCOUNTER — Other Ambulatory Visit: Payer: Self-pay

## 2015-12-15 MED ORDER — PEG 3350-KCL-NABCB-NACL-NASULF 236 G PO SOLR: 4000.0000 mL | Freq: Once | ORAL | Status: DC

## 2015-12-16 ENCOUNTER — Encounter: Payer: Self-pay | Admitting: Student in an Organized Health Care Education/Training Program

## 2015-12-16 ENCOUNTER — Encounter: Payer: Self-pay | Admitting: *Deleted

## 2015-12-17 ENCOUNTER — Other Ambulatory Visit: Payer: Self-pay

## 2015-12-17 MED ORDER — NA SULFATE-K SULFATE-MG SULF 17.5-3.13-1.6 GM/177ML PO SOLN: 1.0000 | ORAL | Status: DC

## 2015-12-18 NOTE — Discharge Instructions (Signed)

## 2015-12-21 ENCOUNTER — Ambulatory Visit: Admission: RE | Admit: 2015-12-21 | Payer: Medicare Other | Source: Ambulatory Visit | Admitting: Gastroenterology

## 2015-12-21 HISTORY — DX: Other symptoms and signs involving the musculoskeletal system: R29.898

## 2015-12-21 HISTORY — DX: Reserved for inherently not codable concepts without codable children: IMO0001

## 2015-12-21 HISTORY — DX: Unspecified dementia, unspecified severity, without behavioral disturbance, psychotic disturbance, mood disturbance, and anxiety: F03.90

## 2015-12-21 HISTORY — DX: Motion sickness, initial encounter: T75.3XXA

## 2015-12-21 HISTORY — DX: Unspecified osteoarthritis, unspecified site: M19.90

## 2015-12-21 HISTORY — DX: Presence of dental prosthetic device (complete) (partial): Z97.2

## 2015-12-21 SURGERY — COLONOSCOPY WITH PROPOFOL
Anesthesia: Choice

## 2016-10-24 ENCOUNTER — Emergency Department: Payer: Medicare Other

## 2016-10-24 ENCOUNTER — Encounter: Payer: Self-pay | Admitting: Emergency Medicine

## 2016-10-24 ENCOUNTER — Emergency Department
Admission: EM | Admit: 2016-10-24 | Discharge: 2016-10-24 | Disposition: A | Discharge status: Home / Self Care | Payer: Medicare Other | Attending: Emergency Medicine | Admitting: Emergency Medicine

## 2016-10-24 DIAGNOSIS — I10 Essential (primary) hypertension: Secondary | ICD-10-CM | POA: Insufficient documentation

## 2016-10-24 DIAGNOSIS — R0602 Shortness of breath: Secondary | ICD-10-CM | POA: Insufficient documentation

## 2016-10-24 DIAGNOSIS — Z79899 Other long term (current) drug therapy: Secondary | ICD-10-CM | POA: Diagnosis not present

## 2016-10-24 DIAGNOSIS — F17210 Nicotine dependence, cigarettes, uncomplicated: Secondary | ICD-10-CM | POA: Diagnosis not present

## 2016-10-24 DIAGNOSIS — R197 Diarrhea, unspecified: Secondary | ICD-10-CM | POA: Diagnosis not present

## 2016-10-24 DIAGNOSIS — R55 Syncope and collapse: Secondary | ICD-10-CM | POA: Insufficient documentation

## 2016-10-24 LAB — URINALYSIS, COMPLETE (UACMP) WITH MICROSCOPIC
BILIRUBIN URINE: NEGATIVE
Bacteria, UA: NONE SEEN
GLUCOSE, UA: NEGATIVE mg/dL
KETONES UR: NEGATIVE mg/dL
LEUKOCYTES UA: NEGATIVE
Nitrite: NEGATIVE
PH: 6 (ref 5.0–8.0)
Protein, ur: NEGATIVE mg/dL
SPECIFIC GRAVITY, URINE: 1.014 (ref 1.005–1.030)
Squamous Epithelial / LPF: NONE SEEN

## 2016-10-24 LAB — CBC WITH DIFFERENTIAL/PLATELET
BASOS ABS: 0 10*3/uL (ref 0–0.1)
Basophils Relative: 1 %
EOS ABS: 0.1 10*3/uL (ref 0–0.7)
EOS PCT: 2 %
HCT: 37 % — ABNORMAL LOW (ref 40.0–52.0)
HEMOGLOBIN: 12.5 g/dL — AB (ref 13.0–18.0)
LYMPHS ABS: 0.6 10*3/uL — AB (ref 1.0–3.6)
LYMPHS PCT: 10 %
MCH: 31.5 pg (ref 26.0–34.0)
MCHC: 33.9 g/dL (ref 32.0–36.0)
MCV: 92.9 fL (ref 80.0–100.0)
Monocytes Absolute: 0.5 10*3/uL (ref 0.2–1.0)
Monocytes Relative: 8 %
NEUTROS PCT: 79 %
Neutro Abs: 4.9 10*3/uL (ref 1.4–6.5)
PLATELETS: 306 10*3/uL (ref 150–440)
RBC: 3.98 MIL/uL — AB (ref 4.40–5.90)
RDW: 13.6 % (ref 11.5–14.5)
WBC: 6.1 10*3/uL (ref 3.8–10.6)

## 2016-10-24 LAB — COMPREHENSIVE METABOLIC PANEL
ALK PHOS: 113 U/L (ref 38–126)
ALT: 7 U/L — AB (ref 17–63)
AST: 13 U/L — AB (ref 15–41)
Albumin: 3.5 g/dL (ref 3.5–5.0)
Anion gap: 7 (ref 5–15)
BUN: 12 mg/dL (ref 6–20)
CALCIUM: 8.3 mg/dL — AB (ref 8.9–10.3)
CHLORIDE: 100 mmol/L — AB (ref 101–111)
CO2: 27 mmol/L (ref 22–32)
CREATININE: 0.75 mg/dL (ref 0.61–1.24)
GFR calc non Af Amer: >60 mL/min (ref >60)
Glucose, Bld: 133 mg/dL — ABNORMAL HIGH (ref 65–99)
Potassium: 3.6 mmol/L (ref 3.5–5.1)
SODIUM: 134 mmol/L — AB (ref 135–145)
Total Bilirubin: 0.4 mg/dL (ref 0.3–1.2)
Total Protein: 6 g/dL — ABNORMAL LOW (ref 6.5–8.1)

## 2016-10-24 LAB — LACTIC ACID, PLASMA: LACTIC ACID, VENOUS: 0.8 mmol/L (ref 0.5–1.9)

## 2016-10-24 LAB — TROPONIN I
Troponin I: <0.03 ng/mL (ref <0.03)
Troponin I: <0.03 ng/mL (ref <0.03)

## 2016-10-24 MED ORDER — IPRATROPIUM-ALBUTEROL 0.5-2.5 (3) MG/3ML IN SOLN
3.0000 mL | Freq: Once | RESPIRATORY_TRACT | Status: AC
  Administered 2016-10-24: 3 mL via RESPIRATORY_TRACT
  Filled 2016-10-24: qty 3

## 2016-10-24 MED ORDER — IOPAMIDOL (ISOVUE-370) INJECTION 76%
75.0000 mL | Freq: Once | INTRAVENOUS | Status: AC | PRN
  Administered 2016-10-24: 75 mL via INTRAVENOUS

## 2016-10-24 MED ORDER — SODIUM CHLORIDE 0.9 % IV BOLUS (SEPSIS)
1000.0000 mL | Freq: Once | INTRAVENOUS | Status: AC
  Administered 2016-10-24: 1000 mL via INTRAVENOUS

## 2016-10-24 MED ORDER — METHYLPREDNISOLONE SODIUM SUCC 125 MG IJ SOLR
125.0000 mg | Freq: Once | INTRAMUSCULAR | Status: AC
  Administered 2016-10-24: 125 mg via INTRAVENOUS
  Filled 2016-10-24: qty 2

## 2016-10-24 NOTE — ED Notes (Signed)
Placed on Room Air.  SpO2 90%    Continue to monitor.

## 2016-10-24 NOTE — ED Notes (Signed)
Spoke with Tameka from [facility] regarding patient's condition and discharge. She states she will send transport services now to pick patient up.

## 2016-10-24 NOTE — ED Provider Notes (Signed)
[facility] Provider Note   CSN: [id number] Arrival date & time: 10/24/16  1211     History   Chief Complaint Chief Complaint  Patient presents with  . Near Syncope    HPI Elijah Evans is a 70 y.o. male hx of dementia, HTN, seizure, stroke, myeloma, Here presenting with near syncope. Patient resides at a nursing home. Patient states that he is wheelchair bound at baseline from his previous stroke. He states that been having some diarrhea for several days but denies any vomiting or abdominal pain or fevers. He was trying to go out to smoke and then felt lightheaded and dizzy and diaphoretic and then may have passed out while in his wheelchair. He was admitted in Feb for SBO. He was incidentally found to have myeloma but is not currently under treatment for it. Denies vomiting or abdominal pain and he states that this is different than previous SBO.     The history is provided by the patient.    Past Medical History:  Diagnosis Date  . Arthritis    osteo - pelvic area  . Dementia    early stages  . Hypertension   . Left leg weakness    S/P TIA  . Motion sickness    cars  . Seizures ([place])   . Shortness of breath dyspnea   . Stroke ([place])   . TIA (transient ischemic attack)    X3 - last one approx 2012  . Wears dentures    full upper and lower    Patient Active Problem List   Diagnosis Date Noted  . SBO (small bowel obstruction)   . Small bowel obstruction 12/08/2015    Past Surgical History:  Procedure Laterality Date  . FOOT SURGERY    . TONSILLECTOMY         Home Medications    Prior to Admission medications   Medication Sig Start Date End Date Taking? Authorizing Provider  acetaminophen (TYLENOL) 325 MG tablet Take 2 tablets (650 mg total) by mouth every 4 (four) hours as needed for mild pain, moderate pain or headache. 12/12/15  Yes Hubbard Robinson, MD  carbamazepine (TEGRETOL) 200 MG tablet Take 400 mg by mouth 2 (two) times  daily.   Yes Historical Provider, MD  dipyridamole-aspirin (AGGRENOX) 200-25 MG 12hr capsule Take 1 capsule by mouth 2 (two) times daily.   Yes Historical Provider, MD  fluticasone (FLONASE) 50 MCG/ACT nasal spray Place into both nostrils daily.   Yes Historical Provider, MD  losartan-hydrochlorothiazide (HYZAAR) 100-25 MG tablet Take 1 tablet by mouth daily.   Yes Historical Provider, MD  phenytoin (DILANTIN) 100 MG ER capsule Take 200 mg by mouth at bedtime.   Yes Historical Provider, MD  phenytoin (DILANTIN) 30 MG ER capsule Take 60 mg by mouth 2 (two) times daily.   Yes Historical Provider, MD  simvastatin (ZOCOR) 40 MG tablet Take 40 mg by mouth at bedtime.   Yes Historical Provider, MD  vitamin B-12 (CYANOCOBALAMIN) 1000 MCG tablet Take 1,000 mcg by mouth daily.   Yes Historical Provider, MD  Na Sulfate-K Sulfate-Mg Sulf (SUPREP BOWEL PREP) SOLN Take 1 kit by mouth as directed. See prep instructions when to start taking Patient not taking: Reported on 10/24/2016 12/17/15   Lucilla Lame, MD  polyethylene glycol (GOLYTELY) 236 g solution Take 4,000 mLs by mouth once. Drink one 8 oz glass every 30 mins until stools run clear Patient not taking: Reported on 10/24/2016 12/15/15   Lucilla Lame, MD  Family History Family History  Problem Relation Age of Onset  . CAD Mother   . CAD Father     Social History Social History  Substance Use Topics  . Smoking status: Current Every Day Smoker    Packs/day: 1.00    Years: 30.00    Types: Cigarettes  . Smokeless tobacco: Not on file     Comment: In care facility as of 12/11/15 - smokes only 4-5 cigs/day there  . Alcohol use No     Comment: Used to drink a lot but quit 5 years ago.     Allergies   Patient has no known allergies.   Review of Systems Review of Systems  Gastrointestinal: Positive for diarrhea.  Neurological: Positive for dizziness.  All other systems reviewed and are negative.    Physical Exam Updated Vital Signs BP  130/69   Pulse 79   Temp 97.7 F (36.5 C) (Oral)   Resp 14   Ht 5' 11"  (1.803 m)   Wt 170 lb (77.1 kg)   SpO2 98%   BMI 23.71 kg/m   Physical Exam  Constitutional:  Chronically ill   HENT:  Head: Normocephalic.  MM dry   Eyes: EOM are normal. Pupils are equal, round, and reactive to light.  Neck: Normal range of motion. Neck supple.  Cardiovascular: Normal rate, regular rhythm and normal heart sounds.   Pulmonary/Chest: Effort normal and breath sounds normal. No respiratory distress. He has no wheezes. He has no rales.  Abdominal: Soft. Bowel sounds are normal. He exhibits no distension.  Musculoskeletal: Normal range of motion.  Neurological: He is alert.  Alert, awake. Moving all extremities   Skin: Skin is warm.  Nursing note and vitals reviewed.    ED Treatments / Results  Labs (all labs ordered are listed, but only abnormal results are displayed) Labs Reviewed  CBC WITH DIFFERENTIAL/PLATELET - Abnormal; Notable for the following:       Result Value   RBC 3.98 (*)    Hemoglobin 12.5 (*)    HCT 37.0 (*)    Lymphs Abs 0.6 (*)    All other components within normal limits  COMPREHENSIVE METABOLIC PANEL - Abnormal; Notable for the following:    Sodium 134 (*)    Chloride 100 (*)    Glucose, Bld 133 (*)    Calcium 8.3 (*)    Total Protein 6.0 (*)    AST 13 (*)    ALT 7 (*)    All other components within normal limits  URINALYSIS, COMPLETE (UACMP) WITH MICROSCOPIC - Abnormal; Notable for the following:    Color, Urine YELLOW (*)    APPearance CLEAR (*)    Hgb urine dipstick MODERATE (*)    All other components within normal limits  CULTURE, BLOOD (ROUTINE X 2)  CULTURE, BLOOD (ROUTINE X 2)  URINE CULTURE  TROPONIN I  LACTIC ACID, PLASMA  TROPONIN I    EKG  EKG Interpretation None       Radiology Dg Chest 2 View  Result Date: 10/24/2016 CLINICAL DATA:  70 year old male with unwitnessed syncopal event. Shortness of breath and hypoxia. Smoker.  Initial encounter. EXAM: CHEST  2 VIEW COMPARISON:  Skeletal survey 12/11/2015 and earlier. FINDINGS: Seated AP and lateral views of the chest. Stable calcified granuloma in the right mid lung. Stable lung volumes. Mediastinal contours are stable and within normal limits. Calcified aortic atherosclerosis. Visualized tracheal air column is within normal limits. Chronic increased interstitial markings. No pneumothorax, pulmonary edema, pleural  effusion or acute pulmonary opacity. No acute osseous abnormality identified. IMPRESSION: No acute cardiopulmonary abnormality. Calcified aortic atherosclerosis. Electronically Signed   By: Genevie Ann M.D.   On: 10/24/2016 13:35   Ct Angio Chest Pe W And/or Wo Contrast  Result Date: 10/24/2016 CLINICAL DATA:  Shortness of breath. Possible syncopal episode. History of myeloma. EXAM: CT ANGIOGRAPHY CHEST WITH CONTRAST TECHNIQUE: Multidetector CT imaging of the chest was performed using the standard protocol during bolus administration of intravenous contrast. Multiplanar CT image reconstructions and MIPs were obtained to evaluate the vascular anatomy. CONTRAST:  75 mL Isovue 370 COMPARISON:  Chest CT 12/10/2015 FINDINGS: Cardiovascular: Pulmonary arterial opacification is adequate without evidence of emboli. There is thoracic aortic atherosclerosis without evidence of aneurysm or dissection. Three vessel coronary artery atherosclerosis is noted. The heart is normal in size. No pericardial effusion. Mediastinum/Nodes: No enlarged mediastinal, hilar, or axillary lymph nodes. Thyroid gland and esophagus demonstrate no significant findings. Lungs/Pleura: No pleural effusion or pneumothorax. There is moderate centrilobular emphysema. Small volume secretions are again seen in the distal trachea and mainstem bronchi. There is a similar appearance of moderate bronchial wall thickening bilaterally which is most notable in the lower lobes with scattered areas of bronchial narrowing/  partial opacification. Calcified nodules are unchanged, with the largest measuring 5 mm in the right upper lobe. Scattered punctate ground-glass nodules peripherally in both lungs are also similar to the prior CT. Upper Abdomen: Diffuse enlargement and mild nodularity of the left adrenal gland, unchanged. 3.1 cm right upper pole renal cyst. Advanced atherosclerotic plaque in the visualized abdominal aorta. Musculoskeletal: 2 adjacent subcutaneous low-density foci in the upper back with the larger measuring 3.6 cm and similar to the prior CT, likely sebaceous cysts. Similar but smaller lesion measuring 1.3 cm more inferiorly in the right mid back. Similar, diffusely mottled appearance of the osseous structures likely reflecting known multiple myeloma. Review of the MIP images confirms the above findings. IMPRESSION: 1. No evidence of pulmonary emboli. 2. Emphysema and bronchial wall thickening. 3. Aortic atherosclerosis. 4. Unchanged left adrenal gland enlargement and nodularity. Electronically Signed   By: Logan Bores M.D.   On: 10/24/2016 14:25   Dg Abd 2 Views  Result Date: 10/24/2016 CLINICAL DATA:  70 year old male with unwitnessed syncopal event. Shortness of breath and hypoxia. Smoker. Initial encounter. EXAM: ABDOMEN - 2 VIEW COMPARISON:  12/11/2015 and earlier. FINDINGS: Upright and supine views. No pneumoperitoneum. Non obstructed bowel gas pattern. Stable abdominal and pelvic visceral contours. Widespread degenerative osteophytes in the spine. Aortoiliac calcified atherosclerosis noted. Calcified right femoral artery atherosclerosis. Stable left pelvic phlebolith. No acute osseous abnormality identified. IMPRESSION: Normal bowel gas pattern, no free air. Aortoiliac calcified atherosclerosis. Electronically Signed   By: Genevie Ann M.D.   On: 10/24/2016 13:37    Procedures Procedures (including critical care time)  Medications Ordered in ED Medications  sodium chloride 0.9 % bolus 1,000 mL (1,000  mLs Intravenous New Bag/Given 10/24/16 1227)  iopamidol (ISOVUE-370) 76 % injection 75 mL (75 mLs Intravenous Contrast Given 10/24/16 1400)  methylPREDNISolone sodium succinate (SOLU-MEDROL) 125 mg/2 mL injection 125 mg (125 mg Intravenous Given 10/24/16 1443)  ipratropium-albuterol (DUONEB) 0.5-2.5 (3) MG/3ML nebulizer solution 3 mL (3 mLs Nebulization Given 10/24/16 1443)     Initial Impression / Assessment and Plan / ED Course  I have reviewed the triage vital signs and the nursing notes.  Pertinent labs & imaging results that were available during my care of the patient were reviewed by me and considered  in my medical decision making (see chart for details).  Clinical Course     Elijah Evans is a 70 y.o. male here with near syncope. Patient hypoxic 88% on RA, not on oxygen at the facility. Chronically ill appearing and appears dehydrated. Has hx of myeloma but not on treatment. Differential still broad. Consider pneumonia vs dehydration vs PE. Will do sepsis workup. If CXR clear, may need CT angio to r/o PE. Will hydrate patient.   3:23 PM Labs at baseline. Trop neg x 1. CT angio showed no PE, just emphysema. Given nebs, steroid. Second trop pending. If neg and patient is not hypoxic, then anticipate discharge, or else will admit. Signed out to Dr. Burlene Arnt in the ED.    Final Clinical Impressions(s) / ED Diagnoses   Final diagnoses:  Diarrhea    New Prescriptions New Prescriptions   No medications on file     Drenda Freeze, MD 10/24/16 1524

## 2016-10-24 NOTE — ED Triage Notes (Signed)
Patient from [facility] via [facility]. Staff reports patient went out for a smoke and had a possible unwitnessed syncopal episode. Patient has history of CVA with left-sided deficits. No new deficits noted or reported. Patient denies pain or fever. Reports diarrhea for several days. Patient has history of dementia and is alert at baseline per facility staff. Upon arrival patient has room air oxygen sat of 88%. 2L [place] applied with sat increase to 93%.

## 2016-10-24 NOTE — ED Provider Notes (Signed)
-----------------------------------------   3:49 PM on 10/24/2016 -----------------------------------------  Pt is a patient of Dr. Darl Householder, per his signout if second trop is not trending up and he is not hypoxic he can go home.  ----------------------------------------- 6:08 PM on 10/24/2016 -----------------------------------------  Reported as negative, patient's oxygen saturations are 94-96% on room air, no complaints, eager to go home.   Schuyler Amor, MD 10/24/16 [phone]

## 2016-10-24 NOTE — ED Notes (Signed)
AAOx3.  Skin warm and dry.  NAD 

## 2016-10-25 LAB — URINE CULTURE: CULTURE: NO GROWTH

## 2016-10-29 LAB — CULTURE, BLOOD (ROUTINE X 2)
CULTURE: NO GROWTH
CULTURE: NO GROWTH

## 2017-03-04 ENCOUNTER — Emergency Department
Admission: EM | Admit: 2017-03-04 | Discharge: 2017-03-04 | Disposition: A | Discharge status: Home / Self Care | Payer: Medicare Other | Attending: Emergency Medicine | Admitting: Emergency Medicine

## 2017-03-04 ENCOUNTER — Emergency Department: Payer: Medicare Other

## 2017-03-04 ENCOUNTER — Encounter: Payer: Self-pay | Admitting: Emergency Medicine

## 2017-03-04 DIAGNOSIS — Z79899 Other long term (current) drug therapy: Secondary | ICD-10-CM | POA: Insufficient documentation

## 2017-03-04 DIAGNOSIS — R112 Nausea with vomiting, unspecified: Secondary | ICD-10-CM | POA: Insufficient documentation

## 2017-03-04 DIAGNOSIS — I10 Essential (primary) hypertension: Secondary | ICD-10-CM | POA: Diagnosis not present

## 2017-03-04 DIAGNOSIS — F17210 Nicotine dependence, cigarettes, uncomplicated: Secondary | ICD-10-CM | POA: Diagnosis not present

## 2017-03-04 LAB — COMPREHENSIVE METABOLIC PANEL
ALBUMIN: 3.5 g/dL (ref 3.5–5.0)
ALT: 24 U/L (ref 17–63)
AST: 35 U/L (ref 15–41)
Alkaline Phosphatase: 156 U/L — ABNORMAL HIGH (ref 38–126)
Anion gap: 8 (ref 5–15)
BUN: 11 mg/dL (ref 6–20)
CHLORIDE: 99 mmol/L — AB (ref 101–111)
CO2: 25 mmol/L (ref 22–32)
Calcium: 8.2 mg/dL — ABNORMAL LOW (ref 8.9–10.3)
Creatinine, Ser: 0.71 mg/dL (ref 0.61–1.24)
GFR calc Af Amer: >60 mL/min (ref >60)
GLUCOSE: 143 mg/dL — AB (ref 65–99)
POTASSIUM: 3.3 mmol/L — AB (ref 3.5–5.1)
SODIUM: 132 mmol/L — AB (ref 135–145)
Total Bilirubin: 0.7 mg/dL (ref 0.3–1.2)
Total Protein: 6.3 g/dL — ABNORMAL LOW (ref 6.5–8.1)

## 2017-03-04 LAB — CBC
HEMATOCRIT: 37.3 % — AB (ref 40.0–52.0)
Hemoglobin: 13 g/dL (ref 13.0–18.0)
MCH: 32.5 pg (ref 26.0–34.0)
MCHC: 34.9 g/dL (ref 32.0–36.0)
MCV: 93.1 fL (ref 80.0–100.0)
Platelets: 235 10*3/uL (ref 150–440)
RBC: 4.01 MIL/uL — ABNORMAL LOW (ref 4.40–5.90)
RDW: 14.4 % (ref 11.5–14.5)
WBC: 5 10*3/uL (ref 3.8–10.6)

## 2017-03-04 LAB — PHENYTOIN LEVEL, TOTAL: PHENYTOIN LVL: 19.7 ug/mL (ref 10.0–20.0)

## 2017-03-04 LAB — LIPASE, BLOOD: LIPASE: 16 U/L (ref 11–51)

## 2017-03-04 LAB — CARBAMAZEPINE LEVEL, TOTAL: CARBAMAZEPINE LVL: 5.5 ug/mL (ref 4.0–12.0)

## 2017-03-04 MED ORDER — ONDANSETRON HCL 4 MG/2ML IJ SOLN
4.0000 mg | Freq: Once | INTRAMUSCULAR | Status: AC | PRN
  Administered 2017-03-04: 4 mg via INTRAVENOUS
  Filled 2017-03-04: qty 2

## 2017-03-04 MED ORDER — ONDANSETRON 4 MG PO TBDP: 4.0000 mg | ORAL_TABLET | Freq: Three times a day (TID) | ORAL | 0 refills | Status: DC | PRN

## 2017-03-04 NOTE — ED Triage Notes (Signed)
Pt arrives via [facility] from [facility] with c/o vomiting. Pt is vomiting at this time in triage but otherwise in NAD at this time.

## 2017-03-04 NOTE — Discharge Instructions (Signed)
Uses Zofran melt on your tongue pills 3 times a day as needed for nausea. Please return here for fever, abdominal pain, unable to keep down fluids or feeling sicker.

## 2017-03-04 NOTE — ED Provider Notes (Signed)
[facility] Emergency Department Provider Note   ____________________________________________   First MD Initiated Contact with Patient 03/04/17 [phone]     (approximate)  I have reviewed the triage vital signs and the nursing notes.   HISTORY  Chief Complaint Emesis    HPI Kayman Snuffer is a 71 y.o. male who comes in from nursing home with a history of small bowel obstruction vomiting and abdominal pain. Vomiting repeatedly prior to his arrival here but has not vomited in the emergency room. Patient is at his baseline state of alertness. History provided by nursing home all records and family.   Past Medical History:  Diagnosis Date  . Arthritis    osteo - pelvic area  . Dementia    early stages  . Hypertension   . Left leg weakness    S/P TIA  . Motion sickness    cars  . Seizures ([place])   . Shortness of breath dyspnea   . Stroke ([place])   . TIA (transient ischemic attack)    X3 - last one approx 2012  . Wears dentures    full upper and lower    Patient Active Problem List   Diagnosis Date Noted  . Dilantin toxicity 03/11/2017  . SBO (small bowel obstruction) ([place])   . Small bowel obstruction ([place]) 12/08/2015    Past Surgical History:  Procedure Laterality Date  . FOOT SURGERY    . TONSILLECTOMY      Prior to Admission medications   Medication Sig Start Date End Date Taking? Authorizing Provider  acetaminophen (TYLENOL) 325 MG tablet Take 2 tablets (650 mg total) by mouth every 4 (four) hours as needed for mild pain, moderate pain or headache. 12/12/15   Loflin, Lavona Mound, MD  albuterol (PROVENTIL HFA;VENTOLIN HFA) 108 (90 Base) MCG/ACT inhaler Inhale 2 puffs into the lungs every 6 (six) hours as needed for wheezing or shortness of breath. 03/09/17   Merlyn Lot, MD  carbamazepine (TEGRETOL) 200 MG tablet Take 400 mg by mouth 2 (two) times daily.    [provider]  dipyridamole-aspirin (AGGRENOX) 200-25 MG 12hr  capsule Take 1 capsule by mouth 2 (two) times daily.    [provider]  fluticasone (FLONASE) 50 MCG/ACT nasal spray Place into both nostrils daily.    [provider]  lacosamide 100 MG TABS Take 1 tablet (100 mg total) by mouth 2 (two) times daily. 03/13/17   Hillary Bow, MD  losartan-hydrochlorothiazide (HYZAAR) 100-12.5 MG tablet Take 0.5 tablets by mouth daily.     [provider]  magnesium hydroxide (MILK OF MAGNESIA) 400 MG/5ML suspension Take 30 mLs by mouth daily as needed for moderate constipation.    [provider]  meclizine (ANTIVERT) 32 MG tablet Take 1 tablet (32 mg total) by mouth 3 (three) times daily as needed. 03/06/17   Darel Hong, MD  ondansetron (ZOFRAN ODT) 4 MG disintegrating tablet Take 1 tablet (4 mg total) by mouth every 8 (eight) hours as needed for nausea or vomiting. 03/06/17   Darel Hong, MD  simvastatin (ZOCOR) 40 MG tablet Take 40 mg by mouth at bedtime.    [provider]  vitamin B-12 (CYANOCOBALAMIN) 1000 MCG tablet Take 1,000 mcg by mouth daily.    [provider]    Allergies Patient has no known allergies.  Family History  Problem Relation Age of Onset  . CAD Mother   . Stroke Mother   . CAD Father   . Stroke Father  Social History Social History  Substance Use Topics  . Smoking status: Current Every Day Smoker    Packs/day: 2.00    Years: 50.00    Types: Cigarettes  . Smokeless tobacco: Never Used     Comment: In care facility as of 12/11/15 - smokes only 4-5 cigs/day there  . Alcohol use No     Comment: Used to drink a lot but quit 5 years ago.    Review of Systems Obtained with difficulty with aid of family Constitutional: No fever/chills Eyes: No visual changes. ENT: No sore throat. Cardiovascular: Denies chest pain. Respiratory: Denies shortness of breath. Gastrointestinal: See history of present illness Genitourinary: Negative for dysuria. Musculoskeletal:  Negative for back pain. Skin: Negative for rash. Neurological: Negative for headaches, focal weakness or numbness.   ____________________________________________   PHYSICAL EXAM:  VITAL SIGNS: ED Triage Vitals  Enc Vitals Group     BP 03/04/17 0458 (!) 147/67     Pulse Rate 03/04/17 0458 69     Resp 03/04/17 0458 16     Temp 03/04/17 0458 97.9 F (36.6 C)     Temp Source 03/04/17 0458 Oral     SpO2 03/04/17 0458 97 %     Weight 03/04/17 0505 200 lb (90.7 kg)     Height 03/04/17 0505 6\' 1"  (1.854 m)     Head Circumference --      [facility] --      Pain Score 03/04/17 0457 0     Pain Loc --      Pain Edu? --      Excl. in [place]? --     Constitutional: Alert and oriented. Well appearing and in no acute distressOn discharge Eyes: Conjunctivae are normal. PERRL. EOMI. Head: Atraumatic. Nose: No congestion/rhinnorhea. Mouth/Throat: Mucous membranes are moist.  Oropharynx non-erythematous. Neck: No stridor. Cardiovascular: Normal rate, regular rhythm. Grossly normal heart sounds.  Good peripheral circulation. Respiratory: Normal respiratory effort.  No retractions. Lungs CTAB. Gastrointestinal: Soft and nontender. No distention. No abdominal bruits. No CVA tenderness. Musculoskeletal: No lower extremity tenderness nor edema.  No joint effusions. Skin:  Skin is warm, dry and intact. No rash noted. Psychiatric: Mood and affect are normal. Speech and behavior are normal. Rectal: Patient is not impacted no blood present normal tone no tenderness ____________________________________________   LABS (all labs ordered are listed, but only abnormal results are displayed)  Labs Reviewed  COMPREHENSIVE METABOLIC PANEL - Abnormal; Notable for the following:       Result Value   Sodium 132 (*)    Potassium 3.3 (*)    Chloride 99 (*)    Glucose, Bld 143 (*)    Calcium 8.2 (*)    Total Protein 6.3 (*)    Alkaline Phosphatase 156 (*)    All other components within normal limits  CBC  - Abnormal; Notable for the following:    RBC 4.01 (*)    HCT 37.3 (*)    All other components within normal limits  LIPASE, BLOOD  CARBAMAZEPINE LEVEL, TOTAL  PHENYTOIN LEVEL, TOTAL   ____________________________________________  EKG  EKG read and interpreted by me showed normal sinus rhythm rate of 68 normal axis looks like right bundle branch block no acute ST-T wave changes ____________________________________________  RADIOLOGY Study Result   CLINICAL DATA:  71 year old male with history of small-bowel obstruction presenting with vomiting.  EXAM: DG ABDOMEN ACUTE W/ 1V CHEST  COMPARISON:  Chest CT dated 10/24/2016  FINDINGS: There is emphysematous changes of the  lungs. A 3 mm nodular density in the right mid lung field likely corresponds to the calcified granuloma seen on the prior CT. There is no focal consolidation, pleural effusion, or pneumothorax. The cardiac silhouette is within normal limits. There is atherosclerotic calcification of the aortic arch.  There is no bowel dilatation or evidence of obstruction. No free air or radiopaque calculi noted. There is degenerative changes of the spine. No acute osseous pathology.  IMPRESSION: 1. No acute cardiopulmonary process.  Emphysema. 2. No bowel dilatation or evidence of obstruction.   Electronically Signed   By: Anner Crete M.D.   On: 03/04/2017 06:44      ____________________________________________   PROCEDURES  Procedure(s) performed:   Procedures  Critical Care performed:   ____________________________________________   INITIAL IMPRESSION / ASSESSMENT AND PLAN / ED COURSE  Pertinent labs & imaging results that were available during my care of the patient were reviewed by me and considered in my medical decision making (see chart for details).        ____________________________________________   FINAL CLINICAL IMPRESSION(S) / ED DIAGNOSES  Final diagnoses:    Non-intractable vomiting with nausea, unspecified vomiting type  Intractable vomiting with nausea, unspecified vomiting type      NEW MEDICATIONS STARTED DURING THIS VISIT:  Discharge Medication List as of 03/04/2017  7:24 AM    START taking these medications   Details  ondansetron (ZOFRAN ODT) 4 MG disintegrating tablet Take 1 tablet (4 mg total) by mouth every 8 (eight) hours as needed for nausea or vomiting., Starting Sat 03/04/2017, Print         Note:  This document was prepared using Dragon voice recognition software and may include unintentional dictation errors.    Nena Polio, MD 03/25/17 [phone]

## 2017-03-04 NOTE — ED Notes (Signed)
Pt states that he is unable to urinate at this time.

## 2017-03-04 NOTE — ED Notes (Signed)
Assisted patient with dressing and waiting on ride in room at this time

## 2017-03-06 ENCOUNTER — Emergency Department: Payer: Medicare Other

## 2017-03-06 ENCOUNTER — Emergency Department
Admission: EM | Admit: 2017-03-06 | Discharge: 2017-03-06 | Disposition: A | Discharge status: Home / Self Care | Payer: Medicare Other | Attending: Emergency Medicine | Admitting: Emergency Medicine

## 2017-03-06 ENCOUNTER — Encounter: Payer: Self-pay | Admitting: *Deleted

## 2017-03-06 DIAGNOSIS — R42 Dizziness and giddiness: Secondary | ICD-10-CM | POA: Insufficient documentation

## 2017-03-06 DIAGNOSIS — I10 Essential (primary) hypertension: Secondary | ICD-10-CM | POA: Diagnosis not present

## 2017-03-06 DIAGNOSIS — R112 Nausea with vomiting, unspecified: Secondary | ICD-10-CM | POA: Diagnosis present

## 2017-03-06 DIAGNOSIS — H81392 Other peripheral vertigo, left ear: Secondary | ICD-10-CM | POA: Insufficient documentation

## 2017-03-06 DIAGNOSIS — Z79899 Other long term (current) drug therapy: Secondary | ICD-10-CM | POA: Diagnosis not present

## 2017-03-06 DIAGNOSIS — F17210 Nicotine dependence, cigarettes, uncomplicated: Secondary | ICD-10-CM | POA: Insufficient documentation

## 2017-03-06 LAB — URINALYSIS, COMPLETE (UACMP) WITH MICROSCOPIC
Bacteria, UA: NONE SEEN
Bilirubin Urine: NEGATIVE
Glucose, UA: NEGATIVE mg/dL
Ketones, ur: 80 mg/dL — AB
LEUKOCYTES UA: NEGATIVE
Nitrite: NEGATIVE
PH: 5 (ref 5.0–8.0)
Protein, ur: 30 mg/dL — AB
SPECIFIC GRAVITY, URINE: 1.027 (ref 1.005–1.030)

## 2017-03-06 LAB — HEPATIC FUNCTION PANEL
ALT: 17 U/L (ref 17–63)
AST: 19 U/L (ref 15–41)
Albumin: 3.8 g/dL (ref 3.5–5.0)
Alkaline Phosphatase: 154 U/L — ABNORMAL HIGH (ref 38–126)
BILIRUBIN DIRECT: 0.1 mg/dL (ref 0.1–0.5)
BILIRUBIN INDIRECT: 0.7 mg/dL (ref 0.3–0.9)
BILIRUBIN TOTAL: 0.8 mg/dL (ref 0.3–1.2)
Total Protein: 6.7 g/dL (ref 6.5–8.1)

## 2017-03-06 LAB — BASIC METABOLIC PANEL
Anion gap: 12 (ref 5–15)
BUN: 11 mg/dL (ref 6–20)
CALCIUM: 8.7 mg/dL — AB (ref 8.9–10.3)
CO2: 26 mmol/L (ref 22–32)
CREATININE: 0.63 mg/dL (ref 0.61–1.24)
Chloride: 96 mmol/L — ABNORMAL LOW (ref 101–111)
GFR calc Af Amer: >60 mL/min (ref >60)
GLUCOSE: 121 mg/dL — AB (ref 65–99)
Potassium: 3.4 mmol/L — ABNORMAL LOW (ref 3.5–5.1)
Sodium: 134 mmol/L — ABNORMAL LOW (ref 135–145)

## 2017-03-06 LAB — CBC WITH DIFFERENTIAL/PLATELET
BASOS ABS: 0 10*3/uL (ref 0–0.1)
Basophils Relative: 0 %
EOS ABS: 0 10*3/uL (ref 0–0.7)
EOS PCT: 0 %
HCT: 39.1 % — ABNORMAL LOW (ref 40.0–52.0)
Hemoglobin: 13.3 g/dL (ref 13.0–18.0)
LYMPHS PCT: 3 %
Lymphs Abs: 0.3 10*3/uL — ABNORMAL LOW (ref 1.0–3.6)
MCH: 31.1 pg (ref 26.0–34.0)
MCHC: 34 g/dL (ref 32.0–36.0)
MCV: 91.7 fL (ref 80.0–100.0)
MONO ABS: 0.4 10*3/uL (ref 0.2–1.0)
Monocytes Relative: 5 %
Neutro Abs: 8.3 10*3/uL — ABNORMAL HIGH (ref 1.4–6.5)
Neutrophils Relative %: 92 %
PLATELETS: 262 10*3/uL (ref 150–440)
RBC: 4.26 MIL/uL — ABNORMAL LOW (ref 4.40–5.90)
RDW: 14.4 % (ref 11.5–14.5)
WBC: 9.1 10*3/uL (ref 3.8–10.6)

## 2017-03-06 LAB — LIPASE, BLOOD: LIPASE: 13 U/L (ref 11–51)

## 2017-03-06 LAB — TROPONIN I

## 2017-03-06 MED ORDER — MECLIZINE HCL 32 MG PO TABS: 32.0000 mg | ORAL_TABLET | Freq: Three times a day (TID) | ORAL | 0 refills | Status: AC | PRN

## 2017-03-06 MED ORDER — ONDANSETRON 4 MG PO TBDP: 4.0000 mg | ORAL_TABLET | Freq: Three times a day (TID) | ORAL | 0 refills | Status: AC | PRN

## 2017-03-06 MED ORDER — ONDANSETRON HCL 4 MG/2ML IJ SOLN
4.0000 mg | Freq: Once | INTRAMUSCULAR | Status: AC
  Administered 2017-03-06: 4 mg via INTRAVENOUS
  Filled 2017-03-06: qty 2

## 2017-03-06 NOTE — ED Triage Notes (Signed)
Per EMS report, patient c/o continued vomiting after being seen in this ED on Saturday for same complaint. Patient also c/o dizziness today.

## 2017-03-06 NOTE — ED Provider Notes (Signed)
[facility] Emergency Department Provider Note  ____________________________________________   First MD Initiated Contact with Patient 03/06/17 1525     (approximate)  I have reviewed the triage vital signs and the nursing notes.   HISTORY  Chief Complaint Nausea and Emesis  History is challenging to obtain secondary to the patient's dementia  HPI Elijah Evans is a 71 y.o. male who comes to the emergency department via EMS with 3-4 days of nausea and room spinning vertigo. The vertigo lasts an hour or 2 at a time. It is not particularly different with motion. He denies abdominal pain. He has not vomited. He denies headache. He denies numbness or weakness. He has a past medical history of dementia, hypertension, seizure disorder, multiple cerebrovascular accidents. He lives at a nursing home, does not drink alcohol, and has a family history of diabetes mellitus. Further history is challenging to obtain secondary to the patient's dementia.   Past Medical History:  Diagnosis Date  . Arthritis    osteo - pelvic area  . Dementia    early stages  . Hypertension   . Left leg weakness    S/P TIA  . Motion sickness    cars  . Seizures ([place])   . Shortness of breath dyspnea   . Stroke ([place])   . TIA (transient ischemic attack)    X3 - last one approx 2012  . Wears dentures    full upper and lower    Patient Active Problem List   Diagnosis Date Noted  . SBO (small bowel obstruction) ([place])   . Small bowel obstruction ([place]) 12/08/2015    Past Surgical History:  Procedure Laterality Date  . FOOT SURGERY    . TONSILLECTOMY      Prior to Admission medications   Medication Sig Start Date End Date Taking? Authorizing Provider  carbamazepine (TEGRETOL) 200 MG tablet Take 400 mg by mouth 2 (two) times daily.   Yes Historical Provider, MD  dipyridamole-aspirin (AGGRENOX) 200-25 MG 12hr capsule Take 1 capsule by mouth 2 (two) times daily.   Yes  Historical Provider, MD  fluticasone (FLONASE) 50 MCG/ACT nasal spray Place into both nostrils daily.   Yes Historical Provider, MD  losartan-hydrochlorothiazide (HYZAAR) 100-25 MG tablet Take 1 tablet by mouth daily.   Yes Historical Provider, MD  phenytoin (DILANTIN) 30 MG ER capsule Take 60 mg by mouth 2 (two) times daily.   Yes Historical Provider, MD  simvastatin (ZOCOR) 40 MG tablet Take 40 mg by mouth at bedtime.   Yes Historical Provider, MD  vitamin B-12 (CYANOCOBALAMIN) 1000 MCG tablet Take 1,000 mcg by mouth daily.   Yes Historical Provider, MD  acetaminophen (TYLENOL) 325 MG tablet Take 2 tablets (650 mg total) by mouth every 4 (four) hours as needed for mild pain, moderate pain or headache. 12/12/15   Hubbard Robinson, MD  meclizine (ANTIVERT) 32 MG tablet Take 1 tablet (32 mg total) by mouth 3 (three) times daily as needed. 03/06/17   Darel Hong, MD  Na Sulfate-K Sulfate-Mg Sulf (SUPREP BOWEL PREP) SOLN Take 1 kit by mouth as directed. See prep instructions when to start taking Patient not taking: Reported on 10/24/2016 12/17/15   Lucilla Lame, MD  ondansetron (ZOFRAN ODT) 4 MG disintegrating tablet Take 1 tablet (4 mg total) by mouth every 8 (eight) hours as needed for nausea or vomiting. 03/06/17   Darel Hong, MD  polyethylene glycol (GOLYTELY) 236 g solution Take 4,000 mLs by mouth once. Drink one  8 oz glass every 30 mins until stools run clear Patient not taking: Reported on 10/24/2016 12/15/15   Lucilla Lame, MD    Allergies Patient has no known allergies.  Family History  Problem Relation Age of Onset  . CAD Mother   . CAD Father     Social History Social History  Substance Use Topics  . Smoking status: Current Every Day Smoker    Packs/day: 1.00    Years: 30.00    Types: Cigarettes  . Smokeless tobacco: Never Used     Comment: In care facility as of 12/11/15 - smokes only 4-5 cigs/day there  . Alcohol use No     Comment: Used to drink a lot but quit 5 years ago.      Review of Systems Level V exemption history Limited by the patient's dementia 10-point ROS otherwise negative.  ____________________________________________   PHYSICAL EXAM:  VITAL SIGNS: ED Triage Vitals  Enc Vitals Group     BP      Pulse      Resp      Temp      Temp src      SpO2      Weight      Height      Head Circumference      Peak Flow      Pain Score      Pain Loc      Pain Edu?      Excl. in [place]?     Constitutional: Chronically ill appearing in no acute distress speaks with slow methodical speech and slightly gurgling words Eyes: PERRL EOMI. Head: Atraumatic. Nose: No congestion/rhinnorhea. Mouth/Throat: No trismus Neck: No stridor.   Cardiovascular: Normal rate, regular rhythm. Grossly normal heart sounds.  Good peripheral circulation. Respiratory: Normal respiratory effort.  No retractions. Lungs CTAB and moving good air Gastrointestinal: Soft nontender Musculoskeletal: No lower extremity edema   Neurologic:   Pupils equal round and reactive to light 6 mm to 4 mm bilaterally significant nystagmus on leftward gaze more than rightward gaze and on upward gaze. Non-fatigable. Abnormal finger-nose-finger on right normal finger-nose-finger on left No pronator drift 5 out of 5 grips biceps triceps Skin:  Skin is warm, dry and intact. No rash noted. Psychiatric: Mood and affect are normal. Speech and behavior are normal.    ____________________________________________   DIFFERENTIAL  Central vertigo, peripheral vertigo, intracerebral hemorrhage, stroke   LABS (all labs ordered are listed, but only abnormal results are displayed)  Labs Reviewed  BASIC METABOLIC PANEL - Abnormal; Notable for the following:       Result Value   Sodium 134 (*)    Potassium 3.4 (*)    Chloride 96 (*)    Glucose, Bld 121 (*)    Calcium 8.7 (*)    All other components within normal limits  HEPATIC FUNCTION PANEL - Abnormal; Notable for the following:    Alkaline  Phosphatase 154 (*)    All other components within normal limits  CBC WITH DIFFERENTIAL/PLATELET - Abnormal; Notable for the following:    RBC 4.26 (*)    HCT 39.1 (*)    Neutro Abs 8.3 (*)    Lymphs Abs 0.3 (*)    All other components within normal limits  URINALYSIS, COMPLETE (UACMP) WITH MICROSCOPIC - Abnormal; Notable for the following:    Color, Urine AMBER (*)    APPearance HAZY (*)    Hgb urine dipstick MODERATE (*)    Ketones, ur 80 (*)  Protein, ur 30 (*)    Squamous Epithelial / LPF 0-5 (*)    All other components within normal limits  LIPASE, BLOOD  TROPONIN I    No evidence of UTI, significant hematuria likely secondary to an in and out catheterization __________________________________________  EKG  ED ECG REPORT I, Darel Hong, the attending physician, personally viewed and interpreted this ECG.  Date: 03/07/2017 Rate: 76 Rhythm: normal sinus rhythm QRS Axis: normal Intervals: normal ST/T Wave abnormalities: normal Conduction Disturbances: Right bundle branch block Narrative Interpretation: Abnormal but unchanged  ____________________________________________  RADIOLOGY  MRI and CT scan of the brain negative for acute pathology ____________________________________________   PROCEDURES  Procedure(s) performed: no  Procedures  Critical Care performed: no  Observation: yes  ----------------------------------------- 1530 PM on 03/06/2017 -----------------------------------------   OBSERVATION CARE: This patient is being placed under observation care for the following reasons: The patient has vertigo of unclear etiology and if it is central he will require inpatient admission while if it is peripheral he will be okay for outpatient management   1800 The patient's symptoms are somewhat improved and he feels less nauseated. Still pending MRI.    ----------------------------------------- 2100 PM on  03/06/2017 -----------------------------------------   END OF OBSERVATION STATUS: After an appropriate period of observation, this patient is being discharged due to the following reason(s):  Neurological exam is stable and he has peripheral vertigo which does not require inpatient admission.  ____________________________________________   INITIAL IMPRESSION / ASSESSMENT AND PLAN / ED COURSE  Pertinent labs & imaging results that were available during my care of the patient were reviewed by me and considered in my medical decision making (see chart for details).    The patient is a challenging historian. He reports near constant vertigo for the past 48 hours and he has significant ataxia in his right hand. Given the duration of his symptoms and think is reasonable to begin with a CT scan but I anticipate an MRI.     Fortunately the patient's MRI is negative for acute infarction. His symptoms have improved. Unclear etiology of the patient's persistent vertigo but likely peripheral and may represent Mnire's disease versus labyrinthitis. I will treat him symptomatically and refer him back to his primary care physician for outpatient management. The patient understands and agrees the plan. ____________________________________________   FINAL CLINICAL IMPRESSION(S) / ED DIAGNOSES  Final diagnoses:  Peripheral vertigo involving left ear      NEW MEDICATIONS STARTED DURING THIS VISIT:  Discharge Medication List as of 03/06/2017  9:17 PM    START taking these medications   Details  meclizine (ANTIVERT) 32 MG tablet Take 1 tablet (32 mg total) by mouth 3 (three) times daily as needed., Starting Mon 03/06/2017, Print         Note:  This document was prepared using Dragon voice recognition software and may include unintentional dictation errors.     Darel Hong, MD 03/07/17 1451

## 2017-03-06 NOTE — Discharge Instructions (Signed)
Please take your meclizine as needed for dizziness and follow up with her primary care physician in 2 days for recheck. Return to the emergency department sooner for any concerns.  It was a pleasure to take care of you today, and thank you for coming to our emergency department.  If you have any questions or concerns before leaving please ask the nurse to grab me and I'm more than happy to go through your aftercare instructions again.  If you were prescribed any opioid pain medication today such as Norco, Vicodin, Percocet, morphine, hydrocodone, or oxycodone please make sure you do not drive when you are taking this medication as it can alter your ability to drive safely.  If you have any concerns once you are home that you are not improving or are in fact getting worse before you can make it to your follow-up appointment, please do not hesitate to call 911 and come back for further evaluation.  Darel Hong MD  Results for orders placed or performed during the hospital encounter of 78/29/56  Basic metabolic panel  Result Value Ref Range   Sodium 134 (L) 135 - 145 mmol/L   Potassium 3.4 (L) 3.5 - 5.1 mmol/L   Chloride 96 (L) 101 - 111 mmol/L   CO2 26 22 - 32 mmol/L   Glucose, Bld 121 (H) 65 - 99 mg/dL   BUN 11 6 - 20 mg/dL   Creatinine, Ser 0.63 0.61 - 1.24 mg/dL   Calcium 8.7 (L) 8.9 - 10.3 mg/dL   GFR calc non Af Amer >60 >60 mL/min   GFR calc Af Amer >60 >60 mL/min   Anion gap 12 5 - 15  Hepatic function panel  Result Value Ref Range   Total Protein 6.7 6.5 - 8.1 g/dL   Albumin 3.8 3.5 - 5.0 g/dL   AST 19 15 - 41 U/L   ALT 17 17 - 63 U/L   Alkaline Phosphatase 154 (H) 38 - 126 U/L   Total Bilirubin 0.8 0.3 - 1.2 mg/dL   Bilirubin, Direct 0.1 0.1 - 0.5 mg/dL   Indirect Bilirubin 0.7 0.3 - 0.9 mg/dL  Lipase, blood  Result Value Ref Range   Lipase 13 11 - 51 U/L  Troponin I  Result Value Ref Range   Troponin I <0.03 <0.03 ng/mL  CBC with Differential  Result Value Ref  Range   WBC 9.1 3.8 - 10.6 K/uL   RBC 4.26 (L) 4.40 - 5.90 MIL/uL   Hemoglobin 13.3 13.0 - 18.0 g/dL   HCT 39.1 (L) 40.0 - 52.0 %   MCV 91.7 80.0 - 100.0 fL   MCH 31.1 26.0 - 34.0 pg   MCHC 34.0 32.0 - 36.0 g/dL   RDW 14.4 11.5 - 14.5 %   Platelets 262 150 - 440 K/uL   Neutrophils Relative % 92 %   Neutro Abs 8.3 (H) 1.4 - 6.5 K/uL   Lymphocytes Relative 3 %   Lymphs Abs 0.3 (L) 1.0 - 3.6 K/uL   Monocytes Relative 5 %   Monocytes Absolute 0.4 0.2 - 1.0 K/uL   Eosinophils Relative 0 %   Eosinophils Absolute 0.0 0 - 0.7 K/uL   Basophils Relative 0 %   Basophils Absolute 0.0 0 - 0.1 K/uL  Urinalysis, Complete w Microscopic  Result Value Ref Range   Color, Urine AMBER (A) YELLOW   APPearance HAZY (A) CLEAR   Specific Gravity, Urine 1.027 1.005 - 1.030   pH 5.0 5.0 - 8.0  Glucose, UA NEGATIVE NEGATIVE mg/dL   Hgb urine dipstick MODERATE (A) NEGATIVE   Bilirubin Urine NEGATIVE NEGATIVE   Ketones, ur 80 (A) NEGATIVE mg/dL   Protein, ur 30 (A) NEGATIVE mg/dL   Nitrite NEGATIVE NEGATIVE   Leukocytes, UA NEGATIVE NEGATIVE   RBC / HPF TOO NUMEROUS TO COUNT 0 - 5 RBC/hpf   WBC, UA 0-5 0 - 5 WBC/hpf   Bacteria, UA NONE SEEN NONE SEEN   Squamous Epithelial / LPF 0-5 (A) NONE SEEN   Mucous PRESENT    Dg Chest 1 View  Result Date: 03/06/2017 CLINICAL DATA:  Patient with vomiting.  Dizziness. EXAM: CHEST 1 VIEW COMPARISON:  Chest radiograph 03/04/2017 FINDINGS: Stable cardiac and mediastinal contours. Aortic atherosclerosis. Unchanged 3 mm nodule right mid lung. No large area of pulmonary consolidation. No pleural effusion or pneumothorax. IMPRESSION: No acute cardiopulmonary process. Aortic atherosclerosis. Electronically Signed   By: Lovey Newcomer M.D.   On: 03/06/2017 15:59   Ct Head Wo Contrast  Result Date: 03/06/2017 CLINICAL DATA:  Vomiting and dizziness for the past 2 days. EXAM: CT HEAD WITHOUT CONTRAST TECHNIQUE: Contiguous axial images were obtained from the base of the skull  through the vertex without intravenous contrast. COMPARISON:  12/08/2015. FINDINGS: Brain: Diffusely enlarged ventricles and subarachnoid spaces. Patchy white matter low density in both cerebral hemispheres. Stable right basal ganglia lacunar infarct or prominent perivascular space. No intracranial hemorrhage, mass lesion or CT evidence of acute infarction. Vascular: No hyperdense vessel or unexpected calcification. Skull: Normal. Negative for fracture or focal lesion. Sinuses/Orbits: Unremarkable. Other: None. IMPRESSION: No acute abnormality. Stable atrophy, chronic small vessel white matter ischemic changes and old right basal ganglia lacunar infarct or prominent perivascular space. Electronically Signed   By: Claudie Revering M.D.   On: 03/06/2017 15:55   Mr Brain Wo Contrast (neuro Protocol)  Result Date: 03/06/2017 CLINICAL DATA:  71 y/o M; nausea, vomiting, and dizziness. History of stroke. EXAM: MRI HEAD WITHOUT CONTRAST TECHNIQUE: Multiplanar, multiecho pulse sequences of the brain and surrounding structures were obtained without intravenous contrast. COMPARISON:  03/06/2017 CT of the head. 11/02/2010 MRI of the head. 10/31/2010 CT of the head. FINDINGS: Brain: No acute infarction, hemorrhage, hydrocephalus, extra-axial collection or new mass lesion. Stable moderate chronic microvascular ischemic changes of white matter parenchymal volume loss of the brain. Small chronic stable lacunar infarcts are present within the right lentiform nucleus extending into posterior corona radiata and within the right caudate head and body. There are multiple masses along the ependyma of the lateral ventricles most pronounced in the posterior frontal and parietal regions that follow-up gray matter signal on all sequences and are stable from 2011 CT and MRI. Vascular: Normal flow voids. Skull and upper cervical spine: Normal marrow signal. Sinuses/Orbits: Negative. Other: 18 x 14 mm T1 isointense nodule within the right  parotid gland incompletely assessed. IMPRESSION: 1. No acute intracranial abnormality identified. 2. Stable moderate chronic microvascular ischemic changes and moderate parenchymal volume loss of the brain. 3. 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 4. 18 mm nodule in the right parotid gland, incompletely assessed. Salivary neoplasm is not excluded. Consider CT of the neck with contrast for further evaluation on a nonemergent basis. Electronically Signed   By: Kristine Garbe M.D.   On: 03/06/2017 20:19   Dg Abdomen Acute W/chest  Result Date: 03/04/2017 CLINICAL DATA:  71 year old male with history of small-bowel obstruction presenting with vomiting. EXAM: DG ABDOMEN  ACUTE W/ 1V CHEST COMPARISON:  Chest CT dated 10/24/2016 FINDINGS: There is emphysematous changes of the lungs. A 3 mm nodular density in the right mid lung field likely corresponds to the calcified granuloma seen on the prior CT. There is no focal consolidation, pleural effusion, or pneumothorax. The cardiac silhouette is within normal limits. There is atherosclerotic calcification of the aortic arch. There is no bowel dilatation or evidence of obstruction. No free air or radiopaque calculi noted. There is degenerative changes of the spine. No acute osseous pathology. IMPRESSION: 1. No acute cardiopulmonary process.  Emphysema. 2. No bowel dilatation or evidence of obstruction. Electronically Signed   By: Anner Crete M.D.   On: 03/04/2017 06:44

## 2017-03-06 NOTE — ED Notes (Signed)
Report given to EMS. Patient transported back to the [facility].

## 2017-03-06 NOTE — ED Notes (Signed)
Patient taken to imaging. 

## 2017-03-06 NOTE — ED Notes (Signed)
Patient returned from MRI.

## 2017-03-06 NOTE — ED Notes (Signed)
Patient taken to MRI

## 2017-03-09 ENCOUNTER — Emergency Department: Payer: Medicare Other

## 2017-03-09 ENCOUNTER — Emergency Department
Admission: EM | Admit: 2017-03-09 | Discharge: 2017-03-09 | Disposition: A | Discharge status: Home / Self Care | Payer: Medicare Other | Source: Home / Self Care | Attending: Student in an Organized Health Care Education/Training Program | Admitting: Student in an Organized Health Care Education/Training Program

## 2017-03-09 ENCOUNTER — Encounter: Payer: Self-pay | Admitting: Emergency Medicine

## 2017-03-09 DIAGNOSIS — R05 Cough: Secondary | ICD-10-CM | POA: Insufficient documentation

## 2017-03-09 DIAGNOSIS — E860 Dehydration: Secondary | ICD-10-CM | POA: Insufficient documentation

## 2017-03-09 DIAGNOSIS — Z5181 Encounter for therapeutic drug level monitoring: Secondary | ICD-10-CM | POA: Insufficient documentation

## 2017-03-09 DIAGNOSIS — R058 Other specified cough: Secondary | ICD-10-CM

## 2017-03-09 DIAGNOSIS — R4182 Altered mental status, unspecified: Secondary | ICD-10-CM | POA: Diagnosis not present

## 2017-03-09 DIAGNOSIS — Z79899 Other long term (current) drug therapy: Secondary | ICD-10-CM

## 2017-03-09 DIAGNOSIS — G92 Toxic encephalopathy: Secondary | ICD-10-CM | POA: Diagnosis not present

## 2017-03-09 DIAGNOSIS — F17210 Nicotine dependence, cigarettes, uncomplicated: Secondary | ICD-10-CM | POA: Insufficient documentation

## 2017-03-09 DIAGNOSIS — I10 Essential (primary) hypertension: Secondary | ICD-10-CM

## 2017-03-09 LAB — COMPREHENSIVE METABOLIC PANEL
ALBUMIN: 3.6 g/dL (ref 3.5–5.0)
ALT: 23 U/L (ref 17–63)
ANION GAP: 11 (ref 5–15)
AST: 28 U/L (ref 15–41)
Alkaline Phosphatase: 143 U/L — ABNORMAL HIGH (ref 38–126)
BUN: 14 mg/dL (ref 6–20)
CO2: 29 mmol/L (ref 22–32)
Calcium: 8.5 mg/dL — ABNORMAL LOW (ref 8.9–10.3)
Chloride: 94 mmol/L — ABNORMAL LOW (ref 101–111)
Creatinine, Ser: 0.74 mg/dL (ref 0.61–1.24)
GFR calc non Af Amer: >60 mL/min (ref >60)
GLUCOSE: 130 mg/dL — AB (ref 65–99)
POTASSIUM: 3.3 mmol/L — AB (ref 3.5–5.1)
SODIUM: 134 mmol/L — AB (ref 135–145)
Total Bilirubin: 0.6 mg/dL (ref 0.3–1.2)
Total Protein: 6.6 g/dL (ref 6.5–8.1)

## 2017-03-09 LAB — CBC WITH DIFFERENTIAL/PLATELET
BASOS PCT: 0 %
Basophils Absolute: 0 10*3/uL (ref 0–0.1)
EOS ABS: 0 10*3/uL (ref 0–0.7)
EOS PCT: 0 %
HCT: 38.3 % — ABNORMAL LOW (ref 40.0–52.0)
Hemoglobin: 13.1 g/dL (ref 13.0–18.0)
LYMPHS ABS: 0.5 10*3/uL — AB (ref 1.0–3.6)
Lymphocytes Relative: 6 %
MCH: 31.6 pg (ref 26.0–34.0)
MCHC: 34.1 g/dL (ref 32.0–36.0)
MCV: 92.6 fL (ref 80.0–100.0)
MONO ABS: 0.6 10*3/uL (ref 0.2–1.0)
MONOS PCT: 8 %
NEUTROS PCT: 86 %
Neutro Abs: 6.7 10*3/uL — ABNORMAL HIGH (ref 1.4–6.5)
PLATELETS: 254 10*3/uL (ref 150–440)
RBC: 4.14 MIL/uL — ABNORMAL LOW (ref 4.40–5.90)
RDW: 14.5 % (ref 11.5–14.5)
WBC: 7.8 10*3/uL (ref 3.8–10.6)

## 2017-03-09 LAB — PROTIME-INR
INR: 0.92
Prothrombin Time: 12.3 seconds (ref 11.4–15.2)

## 2017-03-09 MED ORDER — SODIUM CHLORIDE 0.9 % IV BOLUS (SEPSIS)
1000.0000 mL | Freq: Once | INTRAVENOUS | Status: AC
  Administered 2017-03-09: 1000 mL via INTRAVENOUS

## 2017-03-09 MED ORDER — DOXYCYCLINE HYCLATE 100 MG PO TABS
100.0000 mg | ORAL_TABLET | Freq: Once | ORAL | Status: AC
  Administered 2017-03-09: 100 mg via ORAL
  Filled 2017-03-09: qty 1

## 2017-03-09 MED ORDER — IPRATROPIUM-ALBUTEROL 0.5-2.5 (3) MG/3ML IN SOLN
3.0000 mL | Freq: Once | RESPIRATORY_TRACT | Status: AC
  Administered 2017-03-09: 3 mL via RESPIRATORY_TRACT
  Filled 2017-03-09: qty 3

## 2017-03-09 MED ORDER — ALBUTEROL SULFATE HFA 108 (90 BASE) MCG/ACT IN AERS: 2.0000 | INHALATION_SPRAY | Freq: Four times a day (QID) | RESPIRATORY_TRACT | 2 refills | Status: AC | PRN

## 2017-03-09 MED ORDER — DOXYCYCLINE HYCLATE 50 MG PO CAPS: 100.0000 mg | ORAL_CAPSULE | Freq: Two times a day (BID) | ORAL | 0 refills | Status: AC

## 2017-03-09 NOTE — ED Triage Notes (Signed)
Patient presents to ED via [facility] from [facility]. Family request him come to get his liver function tested. Patient was sent over on the 30th for the same but family never got the results so they are sending him back to get them checked again per EMS. Patient is a poor historian, hx of dementia. 90% on RA, placed on 4L [place].

## 2017-03-09 NOTE — ED Notes (Signed)
Pt taken off of O2 per MD order; pt's O2 sats actively monitored; at this time pt is O2 sats are at 94% on room air.

## 2017-03-09 NOTE — ED Provider Notes (Signed)
[facility] Emergency Department Provider Note    First MD Initiated Contact with Patient 03/09/17 1513     (approximate)  I have reviewed the triage vital signs and the nursing notes.   HISTORY  Chief Complaint Follow-up  Level V Caveat:  Dementia  HPI Elijah Evans is a 71 y.o. male presents from the [place] facility via EMS reportedly due to family request for him to have his liver function tested.  Family also reported they were concerned that he is dehydrated. Patient unable to provide any additional history due to dementia. Patient found to be 90% on room air upon arrival and was placed on 4 L of nasal cannula by EMS. Does have a wet cough. No fevers.   Past Medical History:  Diagnosis Date  . Arthritis    osteo - pelvic area  . Dementia    early stages  . Hypertension   . Left leg weakness    S/P TIA  . Motion sickness    cars  . Seizures ([place])   . Shortness of breath dyspnea   . Stroke ([place])   . TIA (transient ischemic attack)    X3 - last one approx 2012  . Wears dentures    full upper and lower   Family History  Problem Relation Age of Onset  . CAD Mother   . CAD Father    Past Surgical History:  Procedure Laterality Date  . FOOT SURGERY    . TONSILLECTOMY     Patient Active Problem List   Diagnosis Date Noted  . SBO (small bowel obstruction) ([place])   . Small bowel obstruction ([place]) 12/08/2015      Prior to Admission medications   Medication Sig Start Date End Date Taking? Authorizing Provider  acetaminophen (TYLENOL) 325 MG tablet Take 2 tablets (650 mg total) by mouth every 4 (four) hours as needed for mild pain, moderate pain or headache. 12/12/15   Hubbard Cambelle Suchecki, MD  albuterol (PROVENTIL HFA;VENTOLIN HFA) 108 (90 Base) MCG/ACT inhaler Inhale 2 puffs into the lungs every 6 (six) hours as needed for wheezing or shortness of breath. 03/09/17   Merlyn Lot, MD  carbamazepine (TEGRETOL) 200 MG tablet  Take 400 mg by mouth 2 (two) times daily.    Historical Provider, MD  dipyridamole-aspirin (AGGRENOX) 200-25 MG 12hr capsule Take 1 capsule by mouth 2 (two) times daily.    Historical Provider, MD  doxycycline (VIBRAMYCIN) 50 MG capsule Take 2 capsules (100 mg total) by mouth 2 (two) times daily. 03/09/17 03/19/17  Merlyn Lot, MD  fluticasone (FLONASE) 50 MCG/ACT nasal spray Place into both nostrils daily.    Historical Provider, MD  losartan-hydrochlorothiazide (HYZAAR) 100-25 MG tablet Take 1 tablet by mouth daily.    Historical Provider, MD  meclizine (ANTIVERT) 32 MG tablet Take 1 tablet (32 mg total) by mouth 3 (three) times daily as needed. 03/06/17   Darel Hong, MD  Na Sulfate-K Sulfate-Mg Sulf (SUPREP BOWEL PREP) SOLN Take 1 kit by mouth as directed. See prep instructions when to start taking Patient not taking: Reported on 10/24/2016 12/17/15   Lucilla Lame, MD  ondansetron (ZOFRAN ODT) 4 MG disintegrating tablet Take 1 tablet (4 mg total) by mouth every 8 (eight) hours as needed for nausea or vomiting. 03/06/17   Darel Hong, MD  phenytoin (DILANTIN) 30 MG ER capsule Take 60 mg by mouth 2 (two) times daily.    Historical Provider, MD  polyethylene glycol (GOLYTELY) 236 g  solution Take 4,000 mLs by mouth once. Drink one 8 oz glass every 30 mins until stools run clear Patient not taking: Reported on 10/24/2016 12/15/15   Lucilla Lame, MD  simvastatin (ZOCOR) 40 MG tablet Take 40 mg by mouth at bedtime.    Historical Provider, MD  vitamin B-12 (CYANOCOBALAMIN) 1000 MCG tablet Take 1,000 mcg by mouth daily.    Historical Provider, MD    Allergies Patient has no known allergies.    Social History Social History  Substance Use Topics  . Smoking status: Current Every Day Smoker    Packs/day: 1.00    Years: 30.00    Types: Cigarettes  . Smokeless tobacco: Never Used     Comment: In care facility as of 12/11/15 - smokes only 4-5 cigs/day there  . Alcohol use No     Comment: Used to  drink a lot but quit 5 years ago.    Review of Systems Patient denies headaches, rhinorrhea, blurry vision, numbness, shortness of breath, chest pain, edema, cough, abdominal pain, nausea, vomiting, diarrhea, dysuria, fevers, rashes or hallucinations unless otherwise stated above in HPI. ____________________________________________   PHYSICAL EXAM:  VITAL SIGNS: Vitals:   03/09/17 1743 03/09/17 1745  BP:  (!) 123/93  Pulse: 72 72  Resp:    Temp:      Constitutional: Alert elderly and frail appearing, in no acute distress. Eyes: Conjunctivae are normal. PERRL. EOMI. Head: Atraumatic. Nose: No congestion/rhinnorhea. Mouth/Throat: Mucous membranes are moist.  Oropharynx non-erythematous. Neck: No stridor. Painless ROM. No cervical spine tenderness to palpation Hematological/Lymphatic/Immunilogical: No cervical lymphadenopathy. Cardiovascular: Normal rate, regular rhythm. Grossly normal heart sounds.  Good peripheral circulation. Respiratory: Normal respiratory effort.  No retractions. Lungs CTAB. Gastrointestinal: Soft and nontender. No distention. No abdominal bruits. No CVA tenderness. Musculoskeletal: No lower extremity tenderness nor edema.  No joint effusions. Neurologic: no facial droop, no lateralizing weakness Skin:  Skin is warm, dry and intact. No rash noted. Psychiatric: withdrawn ____________________________________________   LABS (all labs ordered are listed, but only abnormal results are displayed)  Results for orders placed or performed during the hospital encounter of 03/09/17 (from the past 24 hour(s))  CBC with Differential/Platelet     Status: Abnormal   Collection Time: 03/09/17  3:20 PM  Result Value Ref Range   WBC 7.8 3.8 - 10.6 K/uL   RBC 4.14 (L) 4.40 - 5.90 MIL/uL   Hemoglobin 13.1 13.0 - 18.0 g/dL   HCT 38.3 (L) 40.0 - 52.0 %   MCV 92.6 80.0 - 100.0 fL   MCH 31.6 26.0 - 34.0 pg   MCHC 34.1 32.0 - 36.0 g/dL   RDW 14.5 11.5 - 14.5 %   Platelets  254 150 - 440 K/uL   Neutrophils Relative % 86 %   Neutro Abs 6.7 (H) 1.4 - 6.5 K/uL   Lymphocytes Relative 6 %   Lymphs Abs 0.5 (L) 1.0 - 3.6 K/uL   Monocytes Relative 8 %   Monocytes Absolute 0.6 0.2 - 1.0 K/uL   Eosinophils Relative 0 %   Eosinophils Absolute 0.0 0 - 0.7 K/uL   Basophils Relative 0 %   Basophils Absolute 0.0 0 - 0.1 K/uL  Comprehensive metabolic panel     Status: Abnormal   Collection Time: 03/09/17  3:20 PM  Result Value Ref Range   Sodium 134 (L) 135 - 145 mmol/L   Potassium 3.3 (L) 3.5 - 5.1 mmol/L   Chloride 94 (L) 101 - 111 mmol/L   CO2 29 22 -  32 mmol/L   Glucose, Bld 130 (H) 65 - 99 mg/dL   BUN 14 6 - 20 mg/dL   Creatinine, Ser 0.74 0.61 - 1.24 mg/dL   Calcium 8.5 (L) 8.9 - 10.3 mg/dL   Total Protein 6.6 6.5 - 8.1 g/dL   Albumin 3.6 3.5 - 5.0 g/dL   AST 28 15 - 41 U/L   ALT 23 17 - 63 U/L   Alkaline Phosphatase 143 (H) 38 - 126 U/L   Total Bilirubin 0.6 0.3 - 1.2 mg/dL   GFR calc non Af Amer >60 >60 mL/min   GFR calc Af Amer >60 >60 mL/min   Anion gap 11 5 - 15  Protime-INR     Status: None   Collection Time: 03/09/17  3:20 PM  Result Value Ref Range   Prothrombin Time 12.3 11.4 - 15.2 seconds   INR 0.92    ____________________________________________  ____________________________________________  RADIOLOGY  I personally reviewed all radiographic images ordered to evaluate for the above acute complaints and reviewed radiology reports and findings.  These findings were personally discussed with the patient.  Please see medical record for radiology report.  ____________________________________________   PROCEDURES  Procedure(s) performed:  Procedures    Critical Care performed: no ____________________________________________   INITIAL IMPRESSION / ASSESSMENT AND PLAN / ED COURSE  Pertinent labs & imaging results that were available during my care of the patient were reviewed by me and considered in my medical decision making (see  chart for details).  DDX: dehydration, electrolye abn, pna, dementia  Tomi Paddock is a 71 y.o. who presents to the ED with Reported need for evaluation of LFTs. Family reports and was able to stop by and informed that the patient's had decreased oral intake over the past 3 weeks and is developing a productive cough. Family were not informed that he is being sent over to the ER interviewed ordered and sent. They state that they did not send him or request him to be sent.  Patient is not critically ill appearing but does appear elderly and frail. Does not appear to be in any acute distress. Based on report of decreased oral intake will check basic labs including chest x-ray and provide IV fluids for dehydration. Patient does not have any hypoxia since arriving to the ER.  The patient will be placed on continuous pulse oximetry and telemetry for monitoring.  Laboratory evaluation will be sent to evaluate for the above complaints.     Clinical Course as of Mar 09 1821  Thu Mar 09, 2017  1731 Blood work is otherwise reassuring. Patient received IV fluids without any palpitation. He is satting 95% on room air. Based on his productive cough with mild left shift on his differential will start on doxycycline.  [PR]    Clinical Course User Index [PR] Merlyn Lot, MD     ____________________________________________   FINAL CLINICAL IMPRESSION(S) / ED DIAGNOSES  Final diagnoses:  Productive cough  Dehydration      NEW MEDICATIONS STARTED DURING THIS VISIT:  New Prescriptions   ALBUTEROL (PROVENTIL HFA;VENTOLIN HFA) 108 (90 BASE) MCG/ACT INHALER    Inhale 2 puffs into the lungs every 6 (six) hours as needed for wheezing or shortness of breath.   DOXYCYCLINE (VIBRAMYCIN) 50 MG CAPSULE    Take 2 capsules (100 mg total) by mouth 2 (two) times daily.     Note:  This document was prepared using Dragon voice recognition software and may include unintentional dictation errors.  Merlyn Lot, MD 03/09/17 Vernelle Emerald

## 2017-03-11 ENCOUNTER — Encounter: Payer: Self-pay | Admitting: Emergency Medicine

## 2017-03-11 ENCOUNTER — Inpatient Hospital Stay
Admission: EM | Admit: 2017-03-11 | Discharge: 2017-03-13 | DRG: 093 | Disposition: A | Discharge status: Skilled Nursing Facility | Payer: Medicare Other | Attending: Internal Medicine | Admitting: Internal Medicine

## 2017-03-11 ENCOUNTER — Emergency Department: Payer: Medicare Other

## 2017-03-11 DIAGNOSIS — Z8673 Personal history of transient ischemic attack (TIA), and cerebral infarction without residual deficits: Secondary | ICD-10-CM

## 2017-03-11 DIAGNOSIS — E785 Hyperlipidemia, unspecified: Secondary | ICD-10-CM | POA: Diagnosis present

## 2017-03-11 DIAGNOSIS — Z7951 Long term (current) use of inhaled steroids: Secondary | ICD-10-CM | POA: Diagnosis not present

## 2017-03-11 DIAGNOSIS — J449 Chronic obstructive pulmonary disease, unspecified: Secondary | ICD-10-CM | POA: Diagnosis present

## 2017-03-11 DIAGNOSIS — Z79899 Other long term (current) drug therapy: Secondary | ICD-10-CM | POA: Diagnosis not present

## 2017-03-11 DIAGNOSIS — F0390 Unspecified dementia without behavioral disturbance: Secondary | ICD-10-CM | POA: Diagnosis present

## 2017-03-11 DIAGNOSIS — E876 Hypokalemia: Secondary | ICD-10-CM | POA: Diagnosis present

## 2017-03-11 DIAGNOSIS — Z8249 Family history of ischemic heart disease and other diseases of the circulatory system: Secondary | ICD-10-CM | POA: Diagnosis not present

## 2017-03-11 DIAGNOSIS — R4182 Altered mental status, unspecified: Secondary | ICD-10-CM | POA: Diagnosis not present

## 2017-03-11 DIAGNOSIS — R569 Unspecified convulsions: Secondary | ICD-10-CM | POA: Diagnosis present

## 2017-03-11 DIAGNOSIS — F17210 Nicotine dependence, cigarettes, uncomplicated: Secondary | ICD-10-CM | POA: Diagnosis present

## 2017-03-11 DIAGNOSIS — R490 Dysphonia: Secondary | ICD-10-CM

## 2017-03-11 DIAGNOSIS — T420X5A Adverse effect of hydantoin derivatives, initial encounter: Secondary | ICD-10-CM | POA: Diagnosis present

## 2017-03-11 DIAGNOSIS — M1990 Unspecified osteoarthritis, unspecified site: Secondary | ICD-10-CM | POA: Diagnosis present

## 2017-03-11 DIAGNOSIS — T420X1D Poisoning by hydantoin derivatives, accidental (unintentional), subsequent encounter: Secondary | ICD-10-CM | POA: Diagnosis not present

## 2017-03-11 DIAGNOSIS — G92 Toxic encephalopathy: Principal | ICD-10-CM | POA: Diagnosis present

## 2017-03-11 DIAGNOSIS — T420X1A Poisoning by hydantoin derivatives, accidental (unintentional), initial encounter: Secondary | ICD-10-CM | POA: Diagnosis present

## 2017-03-11 DIAGNOSIS — I10 Essential (primary) hypertension: Secondary | ICD-10-CM | POA: Diagnosis present

## 2017-03-11 LAB — CBC
HEMATOCRIT: 37.6 % — AB (ref 40.0–52.0)
HEMOGLOBIN: 12.7 g/dL — AB (ref 13.0–18.0)
MCH: 31.1 pg (ref 26.0–34.0)
MCHC: 33.7 g/dL (ref 32.0–36.0)
MCV: 92.3 fL (ref 80.0–100.0)
PLATELETS: 277 10*3/uL (ref 150–440)
RBC: 4.08 MIL/uL — AB (ref 4.40–5.90)
RDW: 14.3 % (ref 11.5–14.5)
WBC: 9.1 10*3/uL (ref 3.8–10.6)

## 2017-03-11 LAB — BASIC METABOLIC PANEL
ANION GAP: 11 (ref 5–15)
BUN: 14 mg/dL (ref 6–20)
CALCIUM: 8.1 mg/dL — AB (ref 8.9–10.3)
CHLORIDE: 95 mmol/L — AB (ref 101–111)
CO2: 26 mmol/L (ref 22–32)
Creatinine, Ser: 0.64 mg/dL (ref 0.61–1.24)
GFR calc non Af Amer: >60 mL/min (ref >60)
GLUCOSE: 112 mg/dL — AB (ref 65–99)
POTASSIUM: 2.8 mmol/L — AB (ref 3.5–5.1)
Sodium: 132 mmol/L — ABNORMAL LOW (ref 135–145)

## 2017-03-11 LAB — PHENYTOIN LEVEL, TOTAL: PHENYTOIN LVL: 30.3 ug/mL — AB (ref 10.0–20.0)

## 2017-03-11 LAB — TROPONIN I

## 2017-03-11 LAB — CARBAMAZEPINE LEVEL, TOTAL: CARBAMAZEPINE LVL: 5.6 ug/mL (ref 4.0–12.0)

## 2017-03-11 MED ORDER — CARBAMAZEPINE 200 MG PO TABS
400.0000 mg | ORAL_TABLET | Freq: Two times a day (BID) | ORAL | Status: DC
  Administered 2017-03-11 – 2017-03-13 (×4): 400 mg via ORAL
  Filled 2017-03-11 (×4): qty 2

## 2017-03-11 MED ORDER — IPRATROPIUM-ALBUTEROL 0.5-2.5 (3) MG/3ML IN SOLN
3.0000 mL | Freq: Four times a day (QID) | RESPIRATORY_TRACT | Status: DC | PRN
  Administered 2017-03-12: 18:00:00 3 mL via RESPIRATORY_TRACT
  Filled 2017-03-11: qty 3

## 2017-03-11 MED ORDER — LOSARTAN POTASSIUM-HCTZ 100-12.5 MG PO TABS: 0.5000 | ORAL_TABLET | Freq: Every day | ORAL | Status: DC

## 2017-03-11 MED ORDER — ONDANSETRON HCL 4 MG/2ML IJ SOLN: 4.0000 mg | Freq: Four times a day (QID) | INTRAMUSCULAR | Status: DC | PRN

## 2017-03-11 MED ORDER — DOXYCYCLINE HYCLATE 100 MG PO TABS
50.0000 mg | ORAL_TABLET | Freq: Two times a day (BID) | ORAL | Status: DC
  Administered 2017-03-11 – 2017-03-13 (×4): 50 mg via ORAL
  Filled 2017-03-11 (×4): qty 1
  Filled 2017-03-11: qty 0.5

## 2017-03-11 MED ORDER — LOSARTAN POTASSIUM 50 MG PO TABS
50.0000 mg | ORAL_TABLET | Freq: Every day | ORAL | Status: DC
  Administered 2017-03-12 – 2017-03-13 (×2): 50 mg via ORAL
  Filled 2017-03-11 (×2): qty 1

## 2017-03-11 MED ORDER — ONDANSETRON HCL 4 MG PO TABS: 4.0000 mg | ORAL_TABLET | Freq: Four times a day (QID) | ORAL | Status: DC | PRN

## 2017-03-11 MED ORDER — SIMVASTATIN 10 MG PO TABS
40.0000 mg | ORAL_TABLET | Freq: Every day | ORAL | Status: DC
  Administered 2017-03-11 – 2017-03-12 (×2): 40 mg via ORAL
  Filled 2017-03-11 (×3): qty 4

## 2017-03-11 MED ORDER — POTASSIUM CHLORIDE 10 MEQ/100ML IV SOLN
10.0000 meq | INTRAVENOUS | Status: AC
  Administered 2017-03-11 (×3): 10 meq via INTRAVENOUS
  Filled 2017-03-11 (×4): qty 100

## 2017-03-11 MED ORDER — ENOXAPARIN SODIUM 40 MG/0.4ML ~~LOC~~ SOLN
40.0000 mg | SUBCUTANEOUS | Status: DC
  Administered 2017-03-11 – 2017-03-12 (×2): 40 mg via SUBCUTANEOUS
  Filled 2017-03-11 (×2): qty 0.4

## 2017-03-11 MED ORDER — ACETAMINOPHEN 650 MG RE SUPP: 650.0000 mg | Freq: Four times a day (QID) | RECTAL | Status: DC | PRN

## 2017-03-11 MED ORDER — ALBUTEROL SULFATE (2.5 MG/3ML) 0.083% IN NEBU: 2.5000 mg | INHALATION_SOLUTION | Freq: Four times a day (QID) | RESPIRATORY_TRACT | Status: DC | PRN

## 2017-03-11 MED ORDER — ASPIRIN-DIPYRIDAMOLE ER 25-200 MG PO CP12
1.0000 | ORAL_CAPSULE | Freq: Two times a day (BID) | ORAL | Status: DC
  Administered 2017-03-11 – 2017-03-13 (×4): 1 via ORAL
  Filled 2017-03-11 (×5): qty 1

## 2017-03-11 MED ORDER — POTASSIUM CHLORIDE CRYS ER 20 MEQ PO TBCR
20.0000 meq | EXTENDED_RELEASE_TABLET | Freq: Two times a day (BID) | ORAL | Status: AC
  Administered 2017-03-11 – 2017-03-13 (×4): 20 meq via ORAL
  Filled 2017-03-11 (×4): qty 1

## 2017-03-11 MED ORDER — VITAMIN B-12 1000 MCG PO TABS
1000.0000 ug | ORAL_TABLET | Freq: Every day | ORAL | Status: DC
  Administered 2017-03-11 – 2017-03-13 (×3): 1000 ug via ORAL
  Filled 2017-03-11 (×3): qty 1

## 2017-03-11 MED ORDER — HYDROCHLOROTHIAZIDE 10 MG/ML ORAL SUSPENSION
6.2500 mg | Freq: Every day | ORAL | Status: DC
  Administered 2017-03-12 – 2017-03-13 (×2): 6.25 mg via ORAL
  Filled 2017-03-11 (×4): qty 1.25

## 2017-03-11 MED ORDER — ACETAMINOPHEN 325 MG PO TABS: 650.0000 mg | ORAL_TABLET | Freq: Four times a day (QID) | ORAL | Status: DC | PRN

## 2017-03-11 MED ORDER — FLUTICASONE PROPIONATE 50 MCG/ACT NA SUSP
2.0000 | Freq: Every day | NASAL | Status: DC
  Administered 2017-03-12 – 2017-03-13 (×2): 2 via NASAL
  Filled 2017-03-11: qty 16

## 2017-03-11 MED ORDER — MAGNESIUM HYDROXIDE 400 MG/5ML PO SUSP: 30.0000 mL | Freq: Every day | ORAL | Status: DC | PRN

## 2017-03-11 NOTE — H&P (Signed)
[facility] [place] NAME: Elijah Evans    MR#:  [id number]  DATE OF BIRTH:  11-24-1945  DATE OF ADMISSION:  03/11/2017  PRIMARY CARE PHYSICIAN: Shari Prows, [facility]   REQUESTING/REFERRING PHYSICIAN: Dr. Lisa Roca  CHIEF COMPLAINT:   Chief Complaint  Patient presents with  . Seizures  Altered Mental Status  HISTORY OF PRESENT ILLNESS:  Elijah Evans  is a 71 y.o. male with a known history of Dementia, previous history of CVA, hypertension, osteoarthritis, COPD with ongoing tobacco abuse who presents to the hospital due to altered mental status. Patient himself given his dementia and altered mental status cannot get a clear history definite most of the history obtained from the chart and also from the granddaughter over the phone. As per the granddaughter patient has had witnessed seizures at the assisted living over the past 2-3 days. Patient is already on antiepileptics including Dilantin and Tegretol. Seems like patient's Dilantin dose was increased given his breakthrough seizures. Patient presented to the ER as his mental status was getting worse and in the ER patient's Dilantin level was over 30. Hospitalist services were contacted further treatment and evaluation.  PAST MEDICAL HISTORY:   Past Medical History:  Diagnosis Date  . Arthritis    osteo - pelvic area  . Dementia    early stages  . Hypertension   . Left leg weakness    S/P TIA  . Motion sickness    cars  . Seizures ([place])   . Shortness of breath dyspnea   . Stroke ([place])   . TIA (transient ischemic attack)    X3 - last one approx 2012  . Wears dentures    full upper and lower    PAST SURGICAL HISTORY:   Past Surgical History:  Procedure Laterality Date  . FOOT SURGERY    . TONSILLECTOMY      SOCIAL HISTORY:   Social History  Substance Use Topics  . Smoking status: Current Every Day Smoker    Packs/day: 2.00    Years: 50.00    Types: Cigarettes   . Smokeless tobacco: Never Used     Comment: In care facility as of 12/11/15 - smokes only 4-5 cigs/day there  . Alcohol use No     Comment: Used to drink a lot but quit 5 years ago.    FAMILY HISTORY:   Family History  Problem Relation Age of Onset  . CAD Mother   . Stroke Mother   . CAD Father   . Stroke Father     DRUG ALLERGIES:  No Known Allergies  REVIEW OF SYSTEMS:   Review of Systems  Unable to perform ROS: Dementia    MEDICATIONS AT HOME:   Prior to Admission medications   Medication Sig Start Date End Date Taking? Authorizing Provider  acetaminophen (TYLENOL) 325 MG tablet Take 2 tablets (650 mg total) by mouth every 4 (four) hours as needed for mild pain, moderate pain or headache. 12/12/15  Yes Loflin, Lavona Mound, MD  albuterol (PROVENTIL HFA;VENTOLIN HFA) 108 (90 Base) MCG/ACT inhaler Inhale 2 puffs into the lungs every 6 (six) hours as needed for wheezing or shortness of breath. 03/09/17  Yes Merlyn Lot, MD  carbamazepine (TEGRETOL) 200 MG tablet Take 400 mg by mouth 2 (two) times daily.   Yes [provider]  dipyridamole-aspirin (AGGRENOX) 200-25 MG 12hr capsule Take 1 capsule by mouth 2 (two) times daily.   Yes [provider]  doxycycline (VIBRAMYCIN) 50 MG capsule Take 2 capsules (100 mg total) by mouth 2 (two) times daily. 03/09/17 03/19/17 Yes Merlyn Lot, MD  fluticasone [facility]) 50 MCG/ACT nasal spray Place into both nostrils daily.   Yes [provider]  losartan-hydrochlorothiazide (HYZAAR) 100-12.5 MG tablet Take 0.5 tablets by mouth daily.    Yes [provider]  magnesium hydroxide (MILK OF MAGNESIA) 400 MG/5ML suspension Take 30 mLs by mouth daily as needed for moderate constipation.   Yes [provider]  meclizine (ANTIVERT) 32 MG tablet Take 1 tablet (32 mg total) by mouth 3 (three) times daily as needed. 03/06/17  Yes Darel Hong, MD  ondansetron (ZOFRAN ODT) 4 MG disintegrating tablet Take  1 tablet (4 mg total) by mouth every 8 (eight) hours as needed for nausea or vomiting. 03/06/17  Yes Darel Hong, MD  phenytoin (DILANTIN) 100 MG ER capsule Take 100-300 mg by mouth 3 (three) times daily. Take 100 mg by mouth at 9 am and 5 pm. Take 300 mg by mouth at bedtime.   Yes [provider]  phenytoin (DILANTIN) 30 MG ER capsule Take 60 mg by mouth 2 (two) times daily.   Yes [provider]  simvastatin (ZOCOR) 40 MG tablet Take 40 mg by mouth at bedtime.   Yes [provider]  vitamin B-12 (CYANOCOBALAMIN) 1000 MCG tablet Take 1,000 mcg by mouth daily.   Yes [provider]  Na Sulfate-K Sulfate-Mg Sulf (SUPREP BOWEL PREP) SOLN Take 1 kit by mouth as directed. See prep instructions when to start taking Patient not taking: Reported on 10/24/2016 12/17/15   Lucilla Lame, MD  polyethylene glycol (GOLYTELY) 236 g solution Take 4,000 mLs by mouth once. Drink one 8 oz glass every 30 mins until stools run clear Patient not taking: Reported on 10/24/2016 12/15/15   Lucilla Lame, MD      VITAL SIGNS:  Blood pressure 136/77, pulse 66, temperature 98.3 F (36.8 C), temperature source Oral, resp. rate 15, height 6' 1"  (1.854 m), weight 77.1 kg (170 lb), SpO2 97 %.  PHYSICAL EXAMINATION:  Physical Exam  GENERAL:  71 y.o.-year-old patient lying in bed in no acute distress.  EYES: Pupils equal, round, reactive to light and accommodation. No scleral icterus. Extraocular muscles intact.  HEENT: Head atraumatic, normocephalic. Oropharynx and nasopharynx clear. No oropharyngeal erythema, moist oral mucosa  NECK:  Supple, no jugular venous distention. No thyroid enlargement, no tenderness.  LUNGS: Poor Resp. Effort, no wheezing, rales, + upper airway rhonchi. No use of accessory muscles of respiration.  CARDIOVASCULAR: S1, S2 RRR. No murmurs, rubs, gallops, clicks.  ABDOMEN: Soft, nontender, nondistended. Bowel sounds present. No organomegaly or mass.  EXTREMITIES: No  pedal edema, cyanosis, or clubbing. + 2 pedal & radial pulses b/l.   NEUROLOGIC: Cranial nerves II through XII are intact. No focal Motor or sensory deficits appreciated b/l. Globally weak. Positive horizontal nystagmus. PSYCHIATRIC: The patient is alert and oriented x 1.  SKIN: No obvious rash, lesion, or ulcer.   LABORATORY PANEL:   CBC  Recent Labs Lab 03/11/17 1310  WBC 9.1  HGB 12.7*  HCT 37.6*  PLT 277   ------------------------------------------------------------------------------------------------------------------  Chemistries   Recent Labs Lab 03/09/17 1520 03/11/17 1310  NA 134* 132*  K 3.3* 2.8*  CL 94* 95*  CO2 29 26  GLUCOSE 130* 112*  BUN 14 14  CREATININE 0.74 0.64  CALCIUM 8.5* 8.1*  AST 28  --   ALT 23  --   ALKPHOS 143*  --  BILITOT 0.6  --    ------------------------------------------------------------------------------------------------------------------  Cardiac Enzymes  Recent Labs Lab 03/11/17 1426  TROPONINI <0.03   ------------------------------------------------------------------------------------------------------------------  RADIOLOGY:  Dg Chest 2 View  Result Date: 03/11/2017 CLINICAL DATA:  Seizure today, former smoker, TIA, stroke. dementia EXAM: CHEST  2 VIEW COMPARISON:  03/09/2017 FINDINGS: Both lateral views are degraded by patient arm position, not raised above the head. Midline trachea. Mild cardiomegaly. Atherosclerosis in the transverse aorta. No pleural effusion or pneumothorax. right upper lobe calcified granuloma. IMPRESSION: No acute cardiopulmonary disease. Cardiomegaly without congestive failure. Aortic atherosclerosis. Electronically Signed   By: Abigail Miyamoto M.D.   On: 03/11/2017 15:11   Ct Head Wo Contrast  Result Date: 03/11/2017 CLINICAL DATA:  Seizures.  Dementia. EXAM: CT HEAD WITHOUT CONTRAST TECHNIQUE: Contiguous axial images were obtained from the base of the skull through the vertex without intravenous  contrast. COMPARISON:  03/06/2017 brain MR and CT. FINDINGS: Brain: Advanced cerebral atrophy. Moderate low density in the periventricular white matter likely related to small vessel disease. Right basal ganglia lacunar infarct versus dilated perivascular space. No mass lesion, hemorrhage, hydrocephalus, acute infarct, intra-axial, or extra-axial fluid collection. Vascular: Intracranial atherosclerosis. Skull: Normal Sinuses/Orbits: Normal imaged portions of the orbits and globes. Cerumen in the left external ear canal. Bilateral middle ear and mastoid air cell opacification. Other: None. IMPRESSION: 1.  No acute intracranial abnormality. 2.  Cerebral atrophy and small vessel ischemic change. 3. Bilateral mastoid and middle ear fluid. Electronically Signed   By: Abigail Miyamoto M.D.   On: 03/11/2017 15:16     IMPRESSION AND PLAN:   71 year old male with past medical history of dementia, previous CVA, history of seizures, COPD with ongoing tobacco abuse, osteoarthritis who presents to the hospital due to altered mental status.  1. Altered mental status-metabolic encephalopathy secondary to Dilantin toxicity. -We'll continue to hold Dilantin, check level in the morning. CT head is negative for acute pathology.  2. Dilantin toxicity-this is the cause of patient's altered mental status. -Patient at baseline is on 120 mg of Dilantin daily, but over the past few days his dose has been increased due to his breakthrough seizures. Patient is presently getting 100 mg 2 times a day daily of Dilantin along with 300 mg at bedtime plus an additional 120 mg which he was already taking. He is a total of 620 mg of Dilantin daily. -We'll hold Dilantin, follow mental status. Get a neurology consult.  3. History of seizures-no acute seizures presently. Continue Tegretol, Tegretol level is normal. Hold Dilantin given the toxicity. -Await further neurologic input.  4. COPD with ongoing tobacco abuse-no acute exacerbation.  Next-continue albuterol inhaler, DuoNeb's as needed.  5. History of previous CVA-continue Aggrenox.  6. Essential hypertension-continue valsartan/HCTZ.  7. Hypokalemia-we'll place on oral potassium supplements, repeat level in the morning. Check magnesium level.  8. Hyperlipidemia - cont. Zocor.    Discussed plan of care with pt's granddaughter over the phone and also with Neurology (Dr. Irish Elders)  All the records are reviewed and case discussed with ED provider. Management plans discussed with the patient, family and they are in agreement.  CODE STATUS: Full code  TOTAL TIME TAKING CARE OF THIS PATIENT: 45 minutes.    Henreitta Leber M.D on 03/11/2017 at 4:27 PM  Between 7am to 6pm - Pager - [phone]  After 6pm go to [email or web address] - password [email or web address] [facility]  [facility]  Office  [phone]  CC: Primary care physician; [facility]

## 2017-03-11 NOTE — NC FL2 (Signed)
[place] LEVEL OF CARE SCREENING TOOL     IDENTIFICATION  Patient Name: Elijah Evans Birthdate: [birth year] Sex: male Admission Date (Current Location): 03/11/2017  [place] and [place] Number:  Engineering geologist and Address:  [facility], [address] [place], [place] [address]      Provider Number: [id number]  Attending Physician Name and Address:  Hillary Bow, MD  Relative Name and Phone Number:     Vlad, [place] Daughter [phone]       Current Level of Care: Hospital Recommended Level of Care: ALF Prior Approval Number:    Date Approved/Denied:   PASRR Number:   [id number]  Discharge Plan: ALF    Current Diagnoses: Patient Active Problem List   Diagnosis Date Noted  . Dilantin toxicity 03/11/2017  . SBO (small bowel obstruction) ([place])   . Small bowel obstruction (H[facility]) 12/08/2015    Orientation RESPIRATION BLADDER Height & Weight     Self  O2 Incontinent Weight: 170 lb (77.1 kg) Height:  _0  (185.4 cm)  BEHAVIORAL SYMPTOMS/MOOD NEUROLOGICAL BOWEL NUTRITION STATUS    Convulsions/Seizures Incontinent  (Normal)  AMBULATORY STATUS COMMUNICATION OF NEEDS Skin   Extensive Assist Non-Verbally Normal                       Personal Care Assistance Level of Assistance  Bathing, Feeding, Dressing, Total care Bathing Assistance: Limited assistance Feeding assistance: Independent Dressing Assistance: Limited assistance Total Care Assistance: Limited assistance   Functional Limitations Info  Sight, Hearing, Speech Sight Info: Adequate Hearing Info: Impaired Speech Info: Impaired    SPECIAL CARE FACTORS FREQUENCY                       Contractures Contractures Info: Not present    Additional Factors Info  Allergies   Allergies Info: none           Current Medications (03/11/2017):  This is the current hospital active medication list Current Facility-Administered Medications   Medication Dose Route Frequency Provider Last Rate Last Dose  . potassium chloride 10 mEq in 100 mL IVPB  10 mEq Intravenous Q1 Hr x 4 Lisa Roca, MD   Stopped at 03/11/17 1630  . potassium chloride SA (K-DUR,KLOR-CON) CR tablet 20 mEq  20 mEq Oral BID Henreitta Leber, MD       Current Outpatient Prescriptions  Medication Sig Dispense Refill  . acetaminophen (TYLENOL) 325 MG tablet Take 2 tablets (650 mg total) by mouth every 4 (four) hours as needed for mild pain, moderate pain or headache. 30 tablet 3  . albuterol (PROVENTIL HFA;VENTOLIN HFA) 108 (90 Base) MCG/ACT inhaler Inhale 2 puffs into the lungs every 6 (six) hours as needed for wheezing or shortness of breath. 1 Inhaler 2  . carbamazepine (TEGRETOL) 200 MG tablet Take 400 mg by mouth 2 (two) times daily.    Marland Kitchen dipyridamole-aspirin (AGGRENOX) 200-25 MG 12hr capsule Take 1 capsule by mouth 2 (two) times daily.    Marland Kitchen doxycycline (VIBRAMYCIN) 50 MG capsule Take 2 capsules (100 mg total) by mouth 2 (two) times daily. 40 capsule 0  . fluticasone (FLONASE) 50 MCG/ACT nasal spray Place into both nostrils daily.    Marland Kitchen losartan-hydrochlorothiazide (HYZAAR) 100-12.5 MG tablet Take 0.5 tablets by mouth daily.     . magnesium hydroxide (MILK OF MAGNESIA) 400 MG/5ML suspension Take 30 mLs by mouth daily as needed for moderate constipation.    . meclizine (ANTIVERT)  32 MG tablet Take 1 tablet (32 mg total) by mouth 3 (three) times daily as needed. 30 tablet 0  . ondansetron (ZOFRAN ODT) 4 MG disintegrating tablet Take 1 tablet (4 mg total) by mouth every 8 (eight) hours as needed for nausea or vomiting. 20 tablet 0  . phenytoin (DILANTIN) 100 MG ER capsule Take 100-300 mg by mouth 3 (three) times daily. Take 100 mg by mouth at 9 am and 5 pm. Take 300 mg by mouth at bedtime.    . phenytoin (DILANTIN) 30 MG ER capsule Take 60 mg by mouth 2 (two) times daily.    . simvastatin (ZOCOR) 40 MG tablet Take 40 mg by mouth at bedtime.    . vitamin B-12  (CYANOCOBALAMIN) 1000 MCG tablet Take 1,000 mcg by mouth daily.    . Na Sulfate-K Sulfate-Mg Sulf (SUPREP BOWEL PREP) SOLN Take 1 kit by mouth as directed. See prep instructions when to start taking (Patient not taking: Reported on 10/24/2016) 1 Bottle 0  . polyethylene glycol (GOLYTELY) 236 g solution Take 4,000 mLs by mouth once. Drink one 8 oz glass every 30 mins until stools run clear (Patient not taking: Reported on 10/24/2016) 4000 mL 0     Discharge Medications: Please see discharge summary for a list of discharge medications.  Current Discharge Medication List        START taking these medications   Details  lacosamide 100 MG TABS Take 1 tablet (100 mg total) by mouth 2 (two) times daily. Qty: 60 tablet, Refills: 0          CONTINUE these medications which have NOT CHANGED   Details  acetaminophen (TYLENOL) 325 MG tablet Take 2 tablets (650 mg total) by mouth every 4 (four) hours as needed for mild pain, moderate pain or headache. Qty: 30 tablet, Refills: 3    albuterol (PROVENTIL HFA;VENTOLIN HFA) 108 (90 Base) MCG/ACT inhaler Inhale 2 puffs into the lungs every 6 (six) hours as needed for wheezing or shortness of breath. Qty: 1 Inhaler, Refills: 2    carbamazepine (TEGRETOL) 200 MG tablet Take 400 mg by mouth 2 (two) times daily.    dipyridamole-aspirin (AGGRENOX) 200-25 MG 12hr capsule Take 1 capsule by mouth 2 (two) times daily.    doxycycline (VIBRAMYCIN) 50 MG capsule Take 2 capsules (100 mg total) by mouth 2 (two) times daily. Qty: 40 capsule, Refills: 0    fluticasone (FLONASE) 50 MCG/ACT nasal spray Place into both nostrils daily.    losartan-hydrochlorothiazide (HYZAAR) 100-12.5 MG tablet Take 0.5 tablets by mouth daily.     magnesium hydroxide (MILK OF MAGNESIA) 400 MG/5ML suspension Take 30 mLs by mouth daily as needed for moderate constipation.    meclizine (ANTIVERT) 32 MG tablet Take 1 tablet (32 mg total) by mouth 3 (three) times daily as  needed. Qty: 30 tablet, Refills: 0    ondansetron (ZOFRAN ODT) 4 MG disintegrating tablet Take 1 tablet (4 mg total) by mouth every 8 (eight) hours as needed for nausea or vomiting. Qty: 20 tablet, Refills: 0    simvastatin (ZOCOR) 40 MG tablet Take 40 mg by mouth at bedtime.    vitamin B-12 (CYANOCOBALAMIN) 1000 MCG tablet Take 1,000 mcg by mouth daily.         STOP taking these medications     phenytoin (DILANTIN) 100 MG ER capsule      phenytoin (DILANTIN) 30 MG ER capsule      Na Sulfate-K Sulfate-Mg Sulf (SUPREP BOWEL PREP) SOLN  polyethylene glycol (GOLYTELY) 236 g solution        Relevant Imaging Results:  Relevant Lab Results:   Additional Information SSN [id number]  Joana Reamer, [place]

## 2017-03-11 NOTE — ED Notes (Signed)
Pt's daughter/legal guardian [name] stated she can be reached at [phone] but is a 4 hour drive out of town. Pt's granddaughter in room stated she is going out for food but may be reached at [phone] if necessary.

## 2017-03-11 NOTE — ED Provider Notes (Signed)
[facility] Emergency Department Provider Note ____________________________________________   I have reviewed the triage vital signs and the triage nursing note.  HISTORY  Chief Complaint Seizures   Historian History limited to patient's underlying dementia, altered mental status and illness.  HPI Elijah Evans is a 71 y.o. male with a history of dementia stays in a nursing home, also history of seizures as well as history of stroke, presents today with altered mental status. Granddaughter is here and thinks he may have had a seizure. She states it is been to the ER a few times in the last one week. In review of the chart history he has been here for possible dehydration and nausea and vomiting. Granddaughter is concerned about possible stroke.  No reported fevers or ongoing vomiting.  Granddaughter has noted some mild coughing and gagging today. No focal weakness or numbness, but he is weak all over. He has been talking intermittently to her.  Symptoms are moderate to severe. Nothing makes it better or worse.    Past Medical History:  Diagnosis Date  . Arthritis    osteo - pelvic area  . Dementia    early stages  . Hypertension   . Left leg weakness    S/P TIA  . Motion sickness    cars  . Seizures ([place])   . Shortness of breath dyspnea   . Stroke ([place])   . TIA (transient ischemic attack)    X3 - last one approx 2012  . Wears dentures    full upper and lower    Patient Active Problem List   Diagnosis Date Noted  . SBO (small bowel obstruction) ([place])   . Small bowel obstruction ([place]) 12/08/2015    Past Surgical History:  Procedure Laterality Date  . FOOT SURGERY    . TONSILLECTOMY      Prior to Admission medications   Medication Sig Start Date End Date Taking? Authorizing Provider  acetaminophen (TYLENOL) 325 MG tablet Take 2 tablets (650 mg total) by mouth every 4 (four) hours as needed for mild pain, moderate pain or headache.  12/12/15  Yes Loflin, Lavona Mound, MD  albuterol (PROVENTIL HFA;VENTOLIN HFA) 108 (90 Base) MCG/ACT inhaler Inhale 2 puffs into the lungs every 6 (six) hours as needed for wheezing or shortness of breath. 03/09/17  Yes Merlyn Lot, MD  carbamazepine (TEGRETOL) 200 MG tablet Take 400 mg by mouth 2 (two) times daily.   Yes [provider]  dipyridamole-aspirin (AGGRENOX) 200-25 MG 12hr capsule Take 1 capsule by mouth 2 (two) times daily.   Yes [provider]  doxycycline (VIBRAMYCIN) 50 MG capsule Take 2 capsules (100 mg total) by mouth 2 (two) times daily. 03/09/17 03/19/17 Yes Merlyn Lot, MD  fluticasone [facility]) 50 MCG/ACT nasal spray Place into both nostrils daily.   Yes [provider]  losartan-hydrochlorothiazide (HYZAAR) 100-12.5 MG tablet Take 0.5 tablets by mouth daily.    Yes [provider]  magnesium hydroxide (MILK OF MAGNESIA) 400 MG/5ML suspension Take 30 mLs by mouth daily as needed for moderate constipation.   Yes [provider]  meclizine (ANTIVERT) 32 MG tablet Take 1 tablet (32 mg total) by mouth 3 (three) times daily as needed. 03/06/17  Yes Darel Hong, MD  ondansetron (ZOFRAN ODT) 4 MG disintegrating tablet Take 1 tablet (4 mg total) by mouth every 8 (eight) hours as needed for nausea or vomiting. 03/06/17  Yes Darel Hong, MD  phenytoin (DILANTIN) 100 MG ER capsule Take 100-300  mg by mouth 3 (three) times daily. Take 100 mg by mouth at 9 am and 5 pm. Take 300 mg by mouth at bedtime.   Yes [provider]  phenytoin (DILANTIN) 30 MG ER capsule Take 60 mg by mouth 2 (two) times daily.   Yes [provider]  simvastatin (ZOCOR) 40 MG tablet Take 40 mg by mouth at bedtime.   Yes [provider]  vitamin B-12 (CYANOCOBALAMIN) 1000 MCG tablet Take 1,000 mcg by mouth daily.   Yes [provider]  Na Sulfate-K Sulfate-Mg Sulf (SUPREP BOWEL PREP) SOLN Take 1 kit by mouth as directed. See prep  instructions when to start taking Patient not taking: Reported on 10/24/2016 12/17/15   Lucilla Lame, MD  polyethylene glycol (GOLYTELY) 236 g solution Take 4,000 mLs by mouth once. Drink one 8 oz glass every 30 mins until stools run clear Patient not taking: Reported on 10/24/2016 12/15/15   Lucilla Lame, MD    No Known Allergies  Family History  Problem Relation Age of Onset  . CAD Mother   . CAD Father     Social History Social History  Substance Use Topics  . Smoking status: Current Every Day Smoker    Packs/day: 1.00    Years: 30.00    Types: Cigarettes  . Smokeless tobacco: Never Used     Comment: In care facility as of 12/11/15 - smokes only 4-5 cigs/day there  . Alcohol use No     Comment: Used to drink a lot but quit 5 years ago.    Review of Systems Level 5 caveat due to altered mental status ____________________________________________   PHYSICAL EXAM:  VITAL SIGNS: ED Triage Vitals  Enc Vitals Group     BP 03/11/17 1300 132/71     Pulse Rate 03/11/17 1303 70     Resp 03/11/17 1303 18     Temp 03/11/17 1303 98.3 F (36.8 C)     Temp Source 03/11/17 1303 Oral     SpO2 03/11/17 1303 96 %     Weight 03/11/17 1304 170 lb (77.1 kg)     Height 03/11/17 1304 6' 1"  (1.854 m)     Head Circumference --      Peak Flow --      Pain Score --      Pain Loc --      Pain Edu? --      Excl. in [place]? --      Constitutional: Alert And intermittently interactive and answering some questions. HEENT   Head: Normocephalic and atraumatic.      Eyes: Conjunctivae are normal. PERRL. Normal extraocular movements.      Ears:         Nose: No congestion/rhinnorhea.   Mouth/Throat: Mucous membranes are mildly dry.   Neck: No stridor. Cardiovascular/Chest: Normal rate, regular rhythm.  No murmurs, rubs, or gallops. Respiratory: Normal respiratory effort without tachypnea nor retractions. Moderate rhonchi bilateral bases. Gastrointestinal: Soft. No distention, no  guarding, no rebound. Nontender.    Genitourinary/rectal:Deferred Musculoskeletal: Nontender with normal range of motion in all extremities. No joint effusions.  No lower extremity tenderness.  No edema. Neurologic:  Mild garbled speech, but at times he is able to enunciate. Weakness in all 4 extremities without focal one sided or one extremity weakness. Skin:  Skin is warm, dry and intact. No rash noted. Psychiatric: No agitation   ____________________________________________  LABS (pertinent positives/negatives)  Labs Reviewed  BASIC METABOLIC PANEL - Abnormal; Notable for the  following:       Result Value   Sodium 132 (*)    Potassium 2.8 (*)    Chloride 95 (*)    Glucose, Bld 112 (*)    Calcium 8.1 (*)    All other components within normal limits  CBC - Abnormal; Notable for the following:    RBC 4.08 (*)    Hemoglobin 12.7 (*)    HCT 37.6 (*)    All other components within normal limits  PHENYTOIN LEVEL, TOTAL - Abnormal; Notable for the following:    Phenytoin Lvl 30.3 (*)    All other components within normal limits  CARBAMAZEPINE LEVEL, TOTAL  TROPONIN I    ____________________________________________    EKG I, Lisa Roca, MD, the attending physician have personally viewed and interpreted all ECGs.  69 bpm. Normal sinus rhythm.RBBB.Marland Kitchen Normal axis. Nonspecific ST and T-wave ____________________________________________  RADIOLOGY All Xrays were viewed by me. Imaging interpreted by Radiologist.  Chest x-ray two-view:  IMPRESSION: No acute cardiopulmonary disease.  Cardiomegaly without congestive failure.  Aortic atherosclerosis.  CT head without contrast:  IMPRESSION: 1.  No acute intracranial abnormality. 2.  Cerebral atrophy and small vessel ischemic change. 3. Bilateral mastoid and middle ear fluid. __________________________________________  PROCEDURES  Procedure(s) performed: None  Critical Care performed:  None  ____________________________________________   ED COURSE / ASSESSMENT AND PLAN  Pertinent labs & imaging results that were available during my care of the patient were reviewed by me and considered in my medical decision making (see chart for details).  Mr. Tiegs is confused, without focal neurologic deficit other than he does occasionally have vomiting speech, but then is able to [facility].  Unclear if he had a seizure, nothing witnessed reportedly. He could be postictal.  CT head was obtained and there is no focal findings there.  Does not appear to have an infectious source.  Dilantin level is 30, significantly elevated. This may be the source of his confusion.  Will admit for further treatment and management.  CONSULTATIONS:  Hospitals for admission, discussed with Dr. Edwina Barth.  Patient / Family / Caregiver informed of clinical course, medical decision-making process, and agree with plan.    ___________________________________________   FINAL CLINICAL IMPRESSION(S) / ED DIAGNOSES   Final diagnoses:  Altered mental status, unspecified altered mental status type  Dilantin toxicity, accidental or unintentional, initial encounter              Note: This dictation was prepared with Dragon dictation. Any transcriptional errors that result from this process are unintentional    Lisa Roca, MD 03/11/17 [phone]

## 2017-03-11 NOTE — ED Triage Notes (Signed)
Pt presents to ED from [facility] c/o seizure. Has hx seizure, on dilantin and carbamazepine. Hx dementia, EMS reports SNF staff told them pt is non-verbal at baseline. Pt appears somnolent, right side nystagmus. No evidence of tongue injury noted at this time.

## 2017-03-11 NOTE — Clinical Social Work Note (Signed)
Clinical Social Work Assessment  Patient Details  Name: Elijah Evans MRN: [id number] Date of Birth: 10-Sep-1946  Date of referral:  03/11/17               Reason for consult:  Other (Comment Required) (From [facility])                Permission sought to share information with:  Guardian, Family Supports (Daughter Elijah Evans [phone] [facility]) Permission granted to share information::  Yes, Verbal Permission Granted  Name::        Agency::  [facility]  Relationship::     Contact Information:  daughter: Elijah Evans [phone]  Housing/Transportation Living arrangements for the past 2 months:  [place] of Information:  Patient Patient Interpreter Needed:  None Criminal Activity/Legal Involvement Pertinent to Current Situation/Hospitalization:  No - Comment as needed Significant Relationships:  Adult Children Lives with:  Facility Resident Do you feel safe going back to the place where you live?  Yes Need for family participation in patient care:  Yes (Comment)  Care giving concerns: Family was informed the patient was having seizure activities at [name].   Social Worker assessment / plan:  LCSW introduced myself to patient who appeared to not be oriented x1, HOH and non verbal. Spoke to patients Guardian and POA daughter Elijah Evans [phone]. According to patients daughter/guardian this patient has had previous seizure activity and for the last 8 days has not been himself he was asperating, had a bronchitis and possible dilantin toxicity. He requires 02. This patient is a 71 year old man and has 3 sons and 1 daughter ( Divorced) He is not diabetic or on a special diet. He needs full assistance with his ADls and is able to feed himself with assistance. He is now unable to get out of bed and transfer and has gone from a wheel chair to electric wheelchair and according to staff patient was unable to raise himself and get out of bed with  assistance. The patient is resident at the [facility] and has lives there for 2 years. He is medicaid/medicare and good family support. Family is concerned of their fathers recent declining state. Patient has dementia and HOH, and non verbal. If you need any further information please contact his daughter Elijah Evans [phone]. Employment status:  Retired Forensic scientist:  Information systems manager, Medicaid In [place] PT Recommendations:  Not assessed at this time [place] / Referral to community resources:  Other (Comment Required) (No resources requested)  Patient/Family's Response to care:  Glad he is being admitted  Patient/Family's Understanding of and Emotional Response to Diagnosis, Current Treatment, and Prognosis: Understand his recent seizures activitie and level of functioning  Emotional Assessment Appearance:  Appears stated age Attitude/Demeanor/Rapport:  Unable to Assess (Non verbal,disoriented) Affect (typically observed):  Unable to Assess Orientation:  Oriented to Self Alcohol / Substance use:  Not Applicable Psych involvement (Current and /or in the community):  No (Comment)  Discharge Needs  Concerns to be addressed:  No discharge needs identified Readmission within the last 30 days:  No Current discharge risk:  Cognitively Impaired Barriers to Discharge:  Continued Medical Work up   Joana Reamer, LCSW 03/11/2017, 4:47 PM

## 2017-03-11 NOTE — Progress Notes (Signed)
LCSW completed assessment and Fl2 started. This patient has a legal guardian daughter [name] [phone]. She is out of town and will be heading back late this evening. LCSW agreed to touch base and follow up. LCSW number was provided.  Patient has 3 sons and verbal consent from guardian has been provided to keep her brothers informed.   [place] LCSW [phone]

## 2017-03-12 ENCOUNTER — Inpatient Hospital Stay: Payer: Medicare Other

## 2017-03-12 DIAGNOSIS — R4182 Altered mental status, unspecified: Secondary | ICD-10-CM

## 2017-03-12 LAB — URINALYSIS, COMPLETE (UACMP) WITH MICROSCOPIC
BILIRUBIN URINE: NEGATIVE
Bacteria, UA: NONE SEEN
GLUCOSE, UA: NEGATIVE mg/dL
Ketones, ur: 5 mg/dL — AB
LEUKOCYTES UA: NEGATIVE
NITRITE: NEGATIVE
PH: 6 (ref 5.0–8.0)
Protein, ur: NEGATIVE mg/dL
SPECIFIC GRAVITY, URINE: 1.026 (ref 1.005–1.030)

## 2017-03-12 LAB — CBC
HCT: 35.6 % — ABNORMAL LOW (ref 40.0–52.0)
HEMOGLOBIN: 12.1 g/dL — AB (ref 13.0–18.0)
MCH: 31.1 pg (ref 26.0–34.0)
MCHC: 34 g/dL (ref 32.0–36.0)
MCV: 91.6 fL (ref 80.0–100.0)
PLATELETS: 248 10*3/uL (ref 150–440)
RBC: 3.88 MIL/uL — AB (ref 4.40–5.90)
RDW: 14.5 % (ref 11.5–14.5)
WBC: 6.1 10*3/uL (ref 3.8–10.6)

## 2017-03-12 LAB — BASIC METABOLIC PANEL
ANION GAP: 5 (ref 5–15)
BUN: 14 mg/dL (ref 6–20)
CALCIUM: 8 mg/dL — AB (ref 8.9–10.3)
CO2: 27 mmol/L (ref 22–32)
CREATININE: 0.6 mg/dL — AB (ref 0.61–1.24)
Chloride: 100 mmol/L — ABNORMAL LOW (ref 101–111)
Glucose, Bld: 101 mg/dL — ABNORMAL HIGH (ref 65–99)
Potassium: 3.4 mmol/L — ABNORMAL LOW (ref 3.5–5.1)
SODIUM: 132 mmol/L — AB (ref 135–145)

## 2017-03-12 LAB — PHENYTOIN LEVEL, TOTAL: PHENYTOIN LVL: 27.5 ug/mL — AB (ref 10.0–20.0)

## 2017-03-12 MED ORDER — LACOSAMIDE 50 MG PO TABS
100.0000 mg | ORAL_TABLET | Freq: Two times a day (BID) | ORAL | Status: DC
  Administered 2017-03-12 – 2017-03-13 (×3): 100 mg via ORAL
  Filled 2017-03-12 (×3): qty 2

## 2017-03-12 NOTE — Consult Note (Signed)
Reason for Consult:confusion  Referring Physician: Dr. Anselm Jungling   CC: confusion and seizures   HPI: Elijah Evans is an 71 y.o. male  known history of Dementia, previous history of CVA, hypertension, osteoarthritis, COPD with ongoing tobacco abuse who presents to the hospital due to altered mental status. Patient has had seizure activity and dilantin was increased.  Admitted with confusion with dilating level over 30.   Past Medical History:  Diagnosis Date  . Arthritis    osteo - pelvic area  . Dementia    early stages  . Hypertension   . Left leg weakness    S/P TIA  . Motion sickness    cars  . Seizures ([place])   . Shortness of breath dyspnea   . Stroke ([place])   . TIA (transient ischemic attack)    X3 - last one approx 2012  . Wears dentures    full upper and lower    Past Surgical History:  Procedure Laterality Date  . FOOT SURGERY    . TONSILLECTOMY      Family History  Problem Relation Age of Onset  . CAD Mother   . Stroke Mother   . CAD Father   . Stroke Father     Social History:  reports that he has been smoking Cigarettes.  He has a 100.00 pack-year smoking history. He has never used smokeless tobacco. He reports that he does not drink alcohol or use drugs.  No Known Allergies  Medications: I have reviewed the patient's current medications.  ROS: Unable to obtain due to confusion and dementia   Physical Examination: Blood pressure (!) 145/75, pulse 67, temperature 98.4 F (36.9 C), temperature source Oral, resp. rate 20, height 6' (1.829 m), weight 75.1 kg (165 lb 9.6 oz), SpO2 94 %.   Neurological Examination   Mental Status: Alert, oriented to name and place only  Cranial Nerves: II: Discs flat bilaterally; Visual fields grossly normal, pupils equal, round, reactive to light and accommodation III,IV, VI: ptosis not present, extra-ocular motions intact bilaterally V,VII: smile symmetric, facial light touch sensation normal  bilaterally VIII: hearing normal bilaterally IX,X: gag reflex present XI: bilateral shoulder shrug XII: midline tongue extension Motor: Right : Upper extremity   5/5    Left:     Upper extremity   5/5  Lower extremity   5/5     Lower extremity   5/5 Tone and bulk:normal tone throughout; no atrophy noted Sensory: Pinprick and light touch intact throughout, bilaterally Deep Tendon Reflexes: 1+ and symmetric throughout Plantars: Right: downgoing   Left: downgoing Cerebellar: normal finger-to-nose, normal rapid alternating movements and normal heel-to-shin test Gait: not tested       Laboratory Studies:   Basic Metabolic Panel:  Recent Labs Lab 03/06/17 1603 03/09/17 1520 03/11/17 1310 03/12/17 0449  NA 134* 134* 132* 132*  K 3.4* 3.3* 2.8* 3.4*  CL 96* 94* 95* 100*  CO2 26 29 26 27   GLUCOSE 121* 130* 112* 101*  BUN 11 14 14 14   CREATININE 0.63 0.74 0.64 0.60*  CALCIUM 8.7* 8.5* 8.1* 8.0*    Liver Function Tests:  Recent Labs Lab 03/06/17 1603 03/09/17 1520  AST 19 28  ALT 17 23  ALKPHOS 154* 143*  BILITOT 0.8 0.6  PROT 6.7 6.6  ALBUMIN 3.8 3.6    Recent Labs Lab 03/06/17 1603  LIPASE 13   No results for input(s): AMMONIA in the last 168 hours.  CBC:  Recent Labs Lab 03/06/17 1603 03/09/17  1520 03/11/17 1310 03/12/17 0449  WBC 9.1 7.8 9.1 6.1  NEUTROABS 8.3* 6.7*  --   --   HGB 13.3 13.1 12.7* 12.1*  HCT 39.1* 38.3* 37.6* 35.6*  MCV 91.7 92.6 92.3 91.6  PLT 262 254 277 248    Cardiac Enzymes:  Recent Labs Lab 03/06/17 1603 03/11/17 1426  TROPONINI <0.03 <0.03    BNP: Invalid input(s): POCBNP  CBG: No results for input(s): GLUCAP in the last 168 hours.  Microbiology: Results for orders placed or performed during the hospital encounter of 10/24/16  Urine culture     Status: None   Collection Time: 10/24/16 12:18 PM  Result Value Ref Range Status   Specimen Description URINE, RANDOM  Final   Special Requests NONE  Final    Culture NO GROWTH Performed at [facility]   Final   Report Status 10/25/2016 FINAL  Final  Blood culture (routine x 2)     Status: None   Collection Time: 10/24/16 12:19 PM  Result Value Ref Range Status   Specimen Description BLOOD RIGHT [facility]  Final   Special Requests   Final    BOTTLES DRAWN AEROBIC AND ANAEROBIC AER 17ML ANA 14ML   Culture NO GROWTH 5 DAYS  Final   Report Status 10/29/2016 FINAL  Final  Blood culture (routine x 2)     Status: None   Collection Time: 10/24/16 12:19 PM  Result Value Ref Range Status   Specimen Description BLOOD LEFT HAND  Final   Special Requests   Final    BOTTLES DRAWN AEROBIC AND ANAEROBIC AER 9ML ANA 12ML   Culture NO GROWTH 5 DAYS  Final   Report Status 10/29/2016 FINAL  Final    Coagulation Studies:  Recent Labs  03/09/17 1520  LABPROT 12.3  INR 0.92    Urinalysis:  Recent Labs Lab 03/06/17 1724  COLORURINE AMBER*  LABSPEC 1.027  PHURINE 5.0  GLUCOSEU NEGATIVE  HGBUR MODERATE*  BILIRUBINUR NEGATIVE  KETONESUR 80*  PROTEINUR 30*  NITRITE NEGATIVE  LEUKOCYTESUR NEGATIVE    Lipid Panel:  No results found for: CHOL, TRIG, HDL, CHOLHDL, VLDL, LDLCALC  HgbA1C: No results found for: HGBA1C  Urine Drug Screen:  No results found for: LABOPIA, COCAINSCRNUR, LABBENZ, AMPHETMU, THCU, LABBARB  Alcohol Level: No results for input(s): ETH in the last 168 hours.  Other results: EKG: normal EKG, normal sinus rhythm, unchanged from previous tracings.  Imaging: Dg Chest 2 View  Result Date: 03/11/2017 CLINICAL DATA:  Seizure today, former smoker, TIA, stroke. dementia EXAM: CHEST  2 VIEW COMPARISON:  03/09/2017 FINDINGS: Both lateral views are degraded by patient arm position, not raised above the head. Midline trachea. Mild cardiomegaly. Atherosclerosis in the transverse aorta. No pleural effusion or pneumothorax. right upper lobe calcified granuloma. IMPRESSION: No acute cardiopulmonary disease. Cardiomegaly without congestive  failure. Aortic atherosclerosis. Electronically Signed   By: Abigail Miyamoto M.D.   On: 03/11/2017 15:11   Ct Head Wo Contrast  Result Date: 03/11/2017 CLINICAL DATA:  Seizures.  Dementia. EXAM: CT HEAD WITHOUT CONTRAST TECHNIQUE: Contiguous axial images were obtained from the base of the skull through the vertex without intravenous contrast. COMPARISON:  03/06/2017 brain MR and CT. FINDINGS: Brain: Advanced cerebral atrophy. Moderate low density in the periventricular white matter likely related to small vessel disease. Right basal ganglia lacunar infarct versus dilated perivascular space. No mass lesion, hemorrhage, hydrocephalus, acute infarct, intra-axial, or extra-axial fluid collection. Vascular: Intracranial atherosclerosis. Skull: Normal Sinuses/Orbits: Normal imaged portions of the  orbits and globes. Cerumen in the left external ear canal. Bilateral middle ear and mastoid air cell opacification. Other: None. IMPRESSION: 1.  No acute intracranial abnormality. 2.  Cerebral atrophy and small vessel ischemic change. 3. Bilateral mastoid and middle ear fluid. Electronically Signed   By: Abigail Miyamoto M.D.   On: 03/11/2017 15:16     Assessment/Plan:  71 y.o. male  known history of Dementia, previous history of CVA, hypertension, osteoarthritis, COPD with ongoing tobacco abuse who presents to the hospital due to altered mental status. Patient has had seizure activity and dilantin was increased.  Admitted with confusion with dilating level over 30.   Dilantin toxicity along with nystagmus.   - Con't home tegretol - do not restart dilantin - pt started on Vimpat 100 BID - d/c planning in the next day of so when mental status improves.   Leotis Pain  03/12/2017, 12:35 PM

## 2017-03-12 NOTE — Progress Notes (Signed)
[facility] [place] NAME: Elijah Evans    MR#:  [id number]  DATE OF BIRTH:  1946-06-21  SUBJECTIVE:  CHIEF COMPLAINT:   Chief Complaint  Patient presents with  . Seizures      Had seizures, so dilantin level was increased last week, now brought with altered mental status, I have seen him around 9;30 am- he ws alert and oriented.  REVIEW OF SYSTEMS:  CONSTITUTIONAL: No fever, fatigue or weakness.  EYES: No blurred or double vision.  EARS, NOSE, AND THROAT: No tinnitus or ear pain.  RESPIRATORY: No cough, shortness of breath, wheezing or hemoptysis.  CARDIOVASCULAR: No chest pain, orthopnea, edema.  GASTROINTESTINAL: No nausea, vomiting, diarrhea or abdominal pain.  GENITOURINARY: No dysuria, hematuria.  ENDOCRINE: No polyuria, nocturia,  HEMATOLOGY: No anemia, easy bruising or bleeding SKIN: No rash or lesion. MUSCULOSKELETAL: No joint pain or arthritis.   NEUROLOGIC: No tingling, numbness, weakness.  PSYCHIATRY: No anxiety or depression.   ROS  DRUG ALLERGIES:  No Known Allergies  VITALS:  Blood pressure 127/69, pulse 73, temperature 98.6 F (37 C), temperature source Oral, resp. rate 18, height 6' (1.829 m), weight 75.1 kg (165 lb 9.6 oz), SpO2 100 %.  PHYSICAL EXAMINATION:  GENERAL:  71 y.o.-year-old patient lying in the bed with no acute distress.  EYES: Pupils equal, round, reactive to light and accommodation. No scleral icterus. Extraocular muscles intact.  HEENT: Head atraumatic, normocephalic. Oropharynx and nasopharynx clear.  NECK:  Supple, no jugular venous distention. No thyroid enlargement, no tenderness.  LUNGS: Normal breath sounds bilaterally, no wheezing, rales,rhonchi or crepitation. No use of accessory muscles of respiration.  CARDIOVASCULAR: S1, S2 normal. No murmurs, rubs, or gallops.  ABDOMEN: Soft, nontender, nondistended. Bowel sounds present. No organomegaly or mass.  EXTREMITIES: No pedal edema,  cyanosis, or clubbing.  NEUROLOGIC: Cranial nerves II through XII are intact. Muscle strength 4/5 in all extremities. Sensation intact. Gait not checked.  PSYCHIATRIC: The patient is alert and oriented x 3.  SKIN: No obvious rash, lesion, or ulcer.   Physical Exam LABORATORY PANEL:   CBC  Recent Labs Lab 03/12/17 0449  WBC 6.1  HGB 12.1*  HCT 35.6*  PLT 248   ------------------------------------------------------------------------------------------------------------------  Chemistries   Recent Labs Lab 03/09/17 1520  03/12/17 0449  NA 134*  < > 132*  K 3.3*  < > 3.4*  CL 94*  < > 100*  CO2 29  < > 27  GLUCOSE 130*  < > 101*  BUN 14  < > 14  CREATININE 0.74  < > 0.60*  CALCIUM 8.5*  < > 8.0*  AST 28  --   --   ALT 23  --   --   ALKPHOS 143*  --   --   BILITOT 0.6  --   --   < > = values in this interval not displayed. ------------------------------------------------------------------------------------------------------------------  Cardiac Enzymes  Recent Labs Lab 03/06/17 1603 03/11/17 1426  TROPONINI <0.03 <0.03   ------------------------------------------------------------------------------------------------------------------  RADIOLOGY:  Dg Chest 2 View  Result Date: 03/11/2017 CLINICAL DATA:  Seizure today, former smoker, TIA, stroke. dementia EXAM: CHEST  2 VIEW COMPARISON:  03/09/2017 FINDINGS: Both lateral views are degraded by patient arm position, not raised above the head. Midline trachea. Mild cardiomegaly. Atherosclerosis in the transverse aorta. No pleural effusion or pneumothorax. right upper lobe calcified granuloma. IMPRESSION: No acute cardiopulmonary disease. Cardiomegaly without congestive failure. Aortic atherosclerosis. Electronically Signed   By: Abigail Miyamoto  M.D.   On: 03/11/2017 15:11   Ct Head Wo Contrast  Result Date: 03/11/2017 CLINICAL DATA:  Seizures.  Dementia. EXAM: CT HEAD WITHOUT CONTRAST TECHNIQUE: Contiguous axial images were  obtained from the base of the skull through the vertex without intravenous contrast. COMPARISON:  03/06/2017 brain MR and CT. FINDINGS: Brain: Advanced cerebral atrophy. Moderate low density in the periventricular white matter likely related to small vessel disease. Right basal ganglia lacunar infarct versus dilated perivascular space. No mass lesion, hemorrhage, hydrocephalus, acute infarct, intra-axial, or extra-axial fluid collection. Vascular: Intracranial atherosclerosis. Skull: Normal Sinuses/Orbits: Normal imaged portions of the orbits and globes. Cerumen in the left external ear canal. Bilateral middle ear and mastoid air cell opacification. Other: None. IMPRESSION: 1.  No acute intracranial abnormality. 2.  Cerebral atrophy and small vessel ischemic change. 3. Bilateral mastoid and middle ear fluid. Electronically Signed   By: Abigail Miyamoto M.D.   On: 03/11/2017 15:16   Dg Chest Port 1 View  Result Date: 03/12/2017 CLINICAL DATA:  Raspy voice EXAM: PORTABLE CHEST 1 VIEW COMPARISON:  03/11/2017 chest radiograph. FINDINGS: Stable cardiomediastinal silhouette with normal heart size and aortic atherosclerosis. No pneumothorax. No pleural effusion. No pulmonary edema. Stable subcentimeter granuloma in the right mid lung. No acute consolidative airspace disease. IMPRESSION: No active disease.  Aortic atherosclerosis. Electronically Signed   By: Ilona Sorrel M.D.   On: 03/12/2017 18:39    ASSESSMENT AND PLAN:   Active Problems:   Dilantin toxicity   * Altered mental status   Dilantin toxicity- toxic encephalopathy     CT head, xray chest and UA are negative.    Stop dilantin.   Appreciated neurology help.   Started on vimpat.  * seizures   Cont tegretol , added vimpat.  * pneumonia vs bronchitis- diagnosed and started on doxy 50 mg bid during ER visit on 03/09/17.    Chest x ray appears clear, but I will finish 5 days course.    * hypertension   Cont home meds.    All the records are  reviewed and case discussed with Care Management/Social Workerr. Management plans discussed with the patient, family and they are in agreement.  CODE STATUS: full.  TOTAL TIME TAKING CARE OF THIS PATIENT: 35 minutes.    POSSIBLE D/C IN 1-2 DAYS, DEPENDING ON CLINICAL CONDITION.   Vaughan Basta M.D on 03/12/2017   Between 7am to 6pm - Pager - [phone]  After 6pm go to [email or web address] - password [email or web address] [facility]  Office  [phone]  CC: Primary care physician; [facility]  Note: This dictation was prepared with Dragon dictation along with smaller phrase technology. Any transcriptional errors that result from this process are unintentional.

## 2017-03-12 NOTE — Progress Notes (Signed)
Patient has a very raspy sounding voice and keeps coughing.  Sounds "wet" in his throat.  Concerned that patient may have aspirated. On call MD, Dr. Verdell Carmine paged and verbal order given for stat chest xray, speech consult, and duoneb treatment.  Patient's O2 sats remains 97-99 on room air.  Clarise Cruz, RN

## 2017-03-13 DIAGNOSIS — T420X1D Poisoning by hydantoin derivatives, accidental (unintentional), subsequent encounter: Secondary | ICD-10-CM

## 2017-03-13 MED ORDER — ENSURE ENLIVE PO LIQD: 237.0000 mL | Freq: Two times a day (BID) | ORAL | Status: DC

## 2017-03-13 MED ORDER — LACOSAMIDE 100 MG PO TABS: 100.0000 mg | ORAL_TABLET | Freq: Two times a day (BID) | ORAL | 0 refills | Status: AC

## 2017-03-13 NOTE — Clinical Social Work Note (Signed)
Patient to be d/c'ed today to [facility].  Patient and family agreeable to plans will transport via ems RN to call report [phone].  Evette Cristal, MSW, [place]

## 2017-03-13 NOTE — Progress Notes (Signed)
Initial Nutrition Assessment  DOCUMENTATION CODES:   Not applicable  INTERVENTION:  Recommend Ensure Enlive po BID, each supplement provides 350 kcal and 20 grams of protein. Encouraged patient to drink until appetite returns to baseline. Patient enjoys chocolate and strawberry flavors.  NUTRITION DIAGNOSIS:   Inadequate oral intake related to lethargy/confusion (dilantin toxicity) as evidenced by per patient/family report.  GOAL:   Patient will meet greater than or equal to 90% of their needs  MONITOR:   PO intake, Supplement acceptance, Labs, Weight trends, I & O's  REASON FOR ASSESSMENT:   Malnutrition Screening Tool    ASSESSMENT:   71 year old male with PMHx of stroke, TIA, HTN, seizures, left leg weakness, dementia, COPD who presents with AMS secondary to dilantin toxicity.   Spoke with patient at bedside. He seems to be a poor historian and is not able to provide a very thorough history. He reports his appetite has been poor for the past 3-4 days. Reports eating 2 meals per day. When asked how much of meals patient completes, he reports he "just messes over it." Unable to provide details on types or amounts of food eaten. Patient reports he has top dentures, but no bottom ones. Per chart patient should have full upper and lower dentures. Denies any N/V or abdominal pain.  Patient reporting weight loss of 90 lbs in the past year, which is likely not an accurate report. Per chart patient was 165.5 lbs on 12/08/2015 and is weight stable. Unsure of accuracy of other weights in chart.  Meal Completion: 0-50% per chart  Medications reviewed and include: vitamin B12 1000 micrograms daily.  Labs reviewed: Sodium 132, Potassium 3.4, Chloride 100.   Nutrition-Focused physical exam completed. Findings are mild-moderate fat depletion, mild-moderate muscle depletion, and no edema.   Discussed patient with SLP. He enjoys Ensure Enlive and had two bottles this morning.  Diet Order:   DIET DYS 3 Room service appropriate? Yes with Assist; Fluid consistency: Thin  Skin:  Reviewed, no issues  Last BM:  03/13/2017 - type 3 per chart  Height:   Ht Readings from Last 1 Encounters:  03/11/17 6' (1.829 m)    Weight:   Wt Readings from Last 1 Encounters:  03/11/17 165 lb 9.6 oz (75.1 kg)    Ideal Body Weight:  80.9 kg  BMI:  Body mass index is 22.46 kg/m.  Estimated Nutritional Needs:   Kcal:  2641-5830  Protein:  75-90 grams  Fluid:  1.8 L/day  EDUCATION NEEDS:   No education needs identified at this time  Willey Blade, MS, RD, LDN Pager: [phone] After Hours Pager: [phone]

## 2017-03-13 NOTE — Progress Notes (Signed)
Subjective: No new seizures noted.  Reports not feeling as dizzy.    Objective: Current vital signs: BP 132/65 (BP Location: Right Arm)   Pulse 79   Temp 97.9 F (36.6 C) (Oral)   Resp 20   Ht 6' (1.829 m)   Wt 75.1 kg (165 lb 9.6 oz)   SpO2 99%   BMI 22.46 kg/m  Vital signs in last 24 hours: Temp:  [97.9 F (36.6 C)-98.7 F (37.1 C)] 97.9 F (36.6 C) (05/07 1324) Pulse Rate:  [70-79] 79 (05/07 1324) Resp:  [16-20] 20 (05/07 1324) BP: (127-140)/(65-70) 132/65 (05/07 1324) SpO2:  [97 %-100 %] 99 % (05/07 1324)  Intake/Output from previous day: 05/06 0701 - 05/07 0700 In: 240 [P.O.:240] Out: 50 [Urine:50] Intake/Output this shift: Total I/O In: 120 [P.O.:120] Out: -  Nutritional status: DIET DYS 3 Room service appropriate? Yes with Assist; Fluid consistency: Thin  Neurologic Exam: Mental Status: Alert, oriented to name and place.  Speech fluent.  Able to follow commands.   Cranial Nerves: II: Discs flat bilaterally; Visual fields grossly normal, pupils equal, round, reactive to light and accommodation III,IV, VI: ptosis not present, extra-ocular motions intact bilaterally, +nystagmus V,VII: smile symmetric, facial light touch sensation normal bilaterally VIII: hearing decreased IX,X: gag reflex present XI: bilateral shoulder shrug XII: midline tongue extension Motor: Right :  Upper extremity   5/5                                      Left:     Upper extremity   5/5             Lower extremity   5/5                                                  Lower extremity   5/5 Tone and bulk:normal tone throughout; no atrophy noted Sensory: Pinprick and light touch intact throughout, bilaterally   Lab Results: Basic Metabolic Panel:  Recent Labs Lab 03/06/17 1603 03/09/17 1520 03/11/17 1310 03/12/17 0449  NA 134* 134* 132* 132*  K 3.4* 3.3* 2.8* 3.4*  CL 96* 94* 95* 100*  CO2 26 29 26 27   GLUCOSE 121* 130* 112* 101*  BUN 11 14 14 14   CREATININE 0.63 0.74 0.64  0.60*  CALCIUM 8.7* 8.5* 8.1* 8.0*    Liver Function Tests:  Recent Labs Lab 03/06/17 1603 03/09/17 1520  AST 19 28  ALT 17 23  ALKPHOS 154* 143*  BILITOT 0.8 0.6  PROT 6.7 6.6  ALBUMIN 3.8 3.6    Recent Labs Lab 03/06/17 1603  LIPASE 13   No results for input(s): AMMONIA in the last 168 hours.  CBC:  Recent Labs Lab 03/06/17 1603 03/09/17 1520 03/11/17 1310 03/12/17 0449  WBC 9.1 7.8 9.1 6.1  NEUTROABS 8.3* 6.7*  --   --   HGB 13.3 13.1 12.7* 12.1*  HCT 39.1* 38.3* 37.6* 35.6*  MCV 91.7 92.6 92.3 91.6  PLT 262 254 277 248    Cardiac Enzymes:  Recent Labs Lab 03/06/17 1603 03/11/17 1426  TROPONINI <0.03 <0.03    Lipid Panel: No results for input(s): CHOL, TRIG, HDL, CHOLHDL, VLDL, LDLCALC in the last 168 hours.  CBG: No results for input(s): GLUCAP in the last  168 hours.  Microbiology: Results for orders placed or performed during the hospital encounter of 10/24/16  Urine culture     Status: None   Collection Time: 10/24/16 12:18 PM  Result Value Ref Range Status   Specimen Description URINE, RANDOM  Final   Special Requests NONE  Final   Culture NO GROWTH Performed at [facility]   Final   Report Status 10/25/2016 FINAL  Final  Blood culture (routine x 2)     Status: None   Collection Time: 10/24/16 12:19 PM  Result Value Ref Range Status   Specimen Description BLOOD RIGHT AC  Final   Special Requests   Final    BOTTLES DRAWN AEROBIC AND ANAEROBIC AER 17ML ANA 14ML   Culture NO GROWTH 5 DAYS  Final   Report Status 10/29/2016 FINAL  Final  Blood culture (routine x 2)     Status: None   Collection Time: 10/24/16 12:19 PM  Result Value Ref Range Status   Specimen Description BLOOD LEFT HAND  Final   Special Requests   Final    BOTTLES DRAWN AEROBIC AND ANAEROBIC AER 9ML ANA 12ML   Culture NO GROWTH 5 DAYS  Final   Report Status 10/29/2016 FINAL  Final    Coagulation Studies: No results for input(s): LABPROT, INR in the last  72 hours.  Imaging: Dg Chest 2 View  Result Date: 03/11/2017 CLINICAL DATA:  Seizure today, former smoker, TIA, stroke. dementia EXAM: CHEST  2 VIEW COMPARISON:  03/09/2017 FINDINGS: Both lateral views are degraded by patient arm position, not raised above the head. Midline trachea. Mild cardiomegaly. Atherosclerosis in the transverse aorta. No pleural effusion or pneumothorax. right upper lobe calcified granuloma. IMPRESSION: No acute cardiopulmonary disease. Cardiomegaly without congestive failure. Aortic atherosclerosis. Electronically Signed   By: Abigail Miyamoto M.D.   On: 03/11/2017 15:11   Ct Head Wo Contrast  Result Date: 03/11/2017 CLINICAL DATA:  Seizures.  Dementia. EXAM: CT HEAD WITHOUT CONTRAST TECHNIQUE: Contiguous axial images were obtained from the base of the skull through the vertex without intravenous contrast. COMPARISON:  03/06/2017 brain MR and CT. FINDINGS: Brain: Advanced cerebral atrophy. Moderate low density in the periventricular white matter likely related to small vessel disease. Right basal ganglia lacunar infarct versus dilated perivascular space. No mass lesion, hemorrhage, hydrocephalus, acute infarct, intra-axial, or extra-axial fluid collection. Vascular: Intracranial atherosclerosis. Skull: Normal Sinuses/Orbits: Normal imaged portions of the orbits and globes. Cerumen in the left external ear canal. Bilateral middle ear and mastoid air cell opacification. Other: None. IMPRESSION: 1.  No acute intracranial abnormality. 2.  Cerebral atrophy and small vessel ischemic change. 3. Bilateral mastoid and middle ear fluid. Electronically Signed   By: Abigail Miyamoto M.D.   On: 03/11/2017 15:16   Dg Chest Port 1 View  Result Date: 03/12/2017 CLINICAL DATA:  Raspy voice EXAM: PORTABLE CHEST 1 VIEW COMPARISON:  03/11/2017 chest radiograph. FINDINGS: Stable cardiomediastinal silhouette with normal heart size and aortic atherosclerosis. No pneumothorax. No pleural effusion. No pulmonary  edema. Stable subcentimeter granuloma in the right mid lung. No acute consolidative airspace disease. IMPRESSION: No active disease.  Aortic atherosclerosis. Electronically Signed   By: Ilona Sorrel M.D.   On: 03/12/2017 18:39    Medications:  I have reviewed the patient's current medications. Scheduled: . carbamazepine  400 mg Oral BID  . dipyridamole-aspirin  1 capsule Oral BID  . doxycycline  50 mg Oral BID  . enoxaparin (LOVENOX) injection  40 mg Subcutaneous Q24H  .  fluticasone  2 spray Each Nare Daily  . losartan  50 mg Oral Daily   And  . hydrochlorothiazide  6.25 mg Oral Daily  . lacosamide  100 mg Oral BID  . simvastatin  40 mg Oral QHS  . vitamin B-12  1,000 mcg Oral Daily    Assessment/Plan: Patient without further seizures.  Symptomatically slowly improving.  Last level 27.5 on yesterday.  On Tegretol and Vimpat.  Tegretol level therapeutic.    Recommendations: 1.  Continue Tegretol and Vimpat at current doses.     LOS: 2 days   Alexis Goodell, MD Neurology [phone] 03/13/2017  1:57 PM

## 2017-03-13 NOTE — Progress Notes (Signed)
Report given to [name] at [name] [phone]). Patient's daughter/POA [name] notified patient will be discharged and EMS will transport back to facility.  Patient and Discharge packet ready. EMS called for transport.

## 2017-03-13 NOTE — Discharge Instructions (Signed)
Resume diet and activity as before  Aspiration precautions with eating

## 2017-03-13 NOTE — Evaluation (Signed)
Clinical/Bedside Swallow Evaluation Patient Details  Name: Elijah Evans MRN: [id number] Date of Birth: 13-Jul-1946  Today's Date: 03/13/2017 Time: SLP Start Time (ACUTE ONLY): 0810 SLP Stop Time (ACUTE ONLY): 0910 SLP Time Calculation (min) (ACUTE ONLY): 60 min  Past Medical History:  Past Medical History:  Diagnosis Date  . Arthritis    osteo - pelvic area  . Dementia    early stages  . Hypertension   . Left leg weakness    S/P TIA  . Motion sickness    cars  . Seizures ([place])   . Shortness of breath dyspnea   . Stroke ([place])   . TIA (transient ischemic attack)    X3 - last one approx 2012  . Wears dentures    full upper and lower   Past Surgical History:  Past Surgical History:  Procedure Laterality Date  . FOOT SURGERY    . TONSILLECTOMY     HPI:  Pt is a 71 y.o. male with a known history of Dementia, previous history of CVA, hypertension, osteoarthritis, COPD with ongoing tobacco abuse who presents to the hospital due to altered mental status. Patient himself given his dementia and altered mental status cannot get a clear history definite most of the history obtained from the chart and also from the granddaughter over the phone. As per the granddaughter patient has had witnessed seizures at the assisted living over the past 2-3 days. Patient is already on antiepileptics including Dilantin and Tegretol. Seems like patient's Dilantin dose was increased given his breakthrough seizures. Patient presented to the ER as his mental status was getting worse and in the ER patient's Dilantin level was over 30. Pt was admitted to this facility's ED 03/04/17 w/ [facility] with c/o vomiting; vomiting in the ED. Pt has been admitted to the ED a few times in the recent week per review of the chart history; possible dehydration and nausea and vomiting w/ decreased oral intake in the past 3 weeks reported. Pt was seen by this services in 2017 w/ GI issues then including esophagitis d/t  recurrent episodes of N/V. He was placed on a Dysphagia 3 w/ thin liquids diet then w/ aspiration precautions. No recent decline/changes noted in CXR reports in the past few months. Pt himself denies any difficulty swallowing.    Assessment / Plan / Recommendation Clinical Impression  Pt appears to present w/ an adequate oropharyngeal phase swallowing function w/ no immediate, overt s/s of aspiration w/ trials of thin liquids and purees/mech soft foods. However, pt did exhibit a mild+ cough x3 post liquid trials which was much delayed and appeared more congested in nature(congested, tight upper airway); a similar congested cough much delayed post swallowing w/ solids x1 - d/t the respiratory effort from mastication time(?). Pt did have recent N/V episodes for which he was brought to the hospital ED ~1 week ago of which he could have aspirated min vomit then thus irritating his upper airway. Pt has had a h/o GI issues including Esophagitis when seen by this service ~1 year ago. Any Esphageal dysmotility issues could increase risk for aspiration of regurgitated material thus impacting the airway. Oral phase c/b min increased mastication time/effort w/ increased textured foods; pt does not have bottom dentition in place to aid mastication. Evaluation was discussed w/ MD who agreed w/ a Dysphagia level 3 diet w/ Thin liquids w/ strict aspiration precautions; Pills in puree or w/ Ensure. NSG and Caregivers to monitor pt's Pulmonary status and overall pt's presentation  for negative sequelae of aspiration and f/u w/ objective swallowing assessment if indicated per MD. Precautions posted.  SLP Visit Diagnosis: Dysphagia, oropharyngeal phase (R13.12)    Aspiration Risk  Mild aspiration risk (but reduced following precautions)    Diet Recommendation  Dysphagia level 3 w/ thin liquids; strict aspiration precautions; monitoring at meals for follow through w/ precautions.  Medication Administration: Whole meds with  puree (or whole w/ Ensure)    Other  Recommendations Recommended Consults:  (Dietician f/u) Oral Care Recommendations: Oral care BID;Staff/trained caregiver to provide oral care   Follow up Recommendations Skilled Nursing facility      Frequency and Duration min 2x/week  1 week       Prognosis Prognosis for Safe Diet Advancement: Fair Barriers to Reach Goals: Cognitive deficits      Swallow Study   General Date of Onset: 03/11/17 HPI: Pt is a 71 y.o. male with a known history of Dementia, previous history of CVA, hypertension, osteoarthritis, COPD with ongoing tobacco abuse who presents to the hospital due to altered mental status. Patient himself given his dementia and altered mental status cannot get a clear history definite most of the history obtained from the chart and also from the granddaughter over the phone. As per the granddaughter patient has had witnessed seizures at the assisted living over the past 2-3 days. Patient is already on antiepileptics including Dilantin and Tegretol. Seems like patient's Dilantin dose was increased given his breakthrough seizures. Patient presented to the ER as his mental status was getting worse and in the ER patient's Dilantin level was over 30. Pt was admitted to this facility's ED 03/04/17 w/ [facility] with c/o vomiting; vomiting in the ED. Pt has been admitted to the ED a few times in the recent week per review of the chart history; possible dehydration and nausea and vomiting w/ decreased oral intake in the past 3 weeks reported. Pt was seen by this services in 2017 w/ GI issues then including esophagitis d/t recurrent episodes of N/V. He was placed on a Dysphagia 3 w/ thin liquids diet then w/ aspiration precautions. No recent decline/changes noted in CXR reports in the past few months. Pt himself denies any difficulty swallowing.  Type of Study: Bedside Swallow Evaluation Previous Swallow Assessment: 12/2015 Diet Prior to this Study: Dysphagia 3  (soft);Thin liquids (recommended) Temperature Spikes Noted: No (wbc 6.1 down from 9.1) Respiratory Status: Room air History of Recent Intubation: No Behavior/Cognition: Alert;Cooperative;Pleasant mood;Confused;Distractible;Requires cueing (HOH) Oral Cavity Assessment: Within Functional Limits;Dry (min) Oral Care Completed by SLP: Recent completion by staff Oral Cavity - Dentition: Dentures, top (no bottom dentition) Vision: Functional for self-feeding Self-Feeding Abilities: Able to feed self;Needs assist;Needs set up (min stiffness in UEs/fingers) Patient Positioning: Upright in bed Baseline Vocal Quality: Normal Volitional Cough: Strong;Congested (min) Volitional Swallow: Able to elicit    Oral/Motor/Sensory Function Overall Oral Motor/Sensory Function: Within functional limits   Ice Chips Ice chips: Within functional limits Presentation: Spoon (fed; 3 trials)   Thin Liquid Thin Liquid: Impaired Presentation: Cup;Self Fed;Straw (~4-6 ozs by each method) Oral Phase Impairments:  (none) Pharyngeal  Phase Impairments: Cough - Delayed (x3(1 w/ straw; 2 w/ cup)) Other Comments: pt's cough was much delayed and appeared congested in nature(congested, tight upper airway)    Nectar Thick Nectar Thick Liquid: Not tested   Honey Thick Honey Thick Liquid: Not tested   Puree Puree: Within functional limits Presentation: Spoon;Self Fed (~3 ozs)   Solid   GO   Solid: Impaired  Presentation: Self Fed;Spoon (x5-6 trials) Oral Phase Impairments: Impaired mastication (min) Oral Phase Functional Implications:  (min increased mastication time/effort w/ increased texture) Pharyngeal Phase Impairments: Cough - Delayed (x1 post mastication effort) Other Comments: similar congested cough much delayed post swallowing - d/t the respiratory effort(?). Pt did better w/ the softer, mech soft foods.         Orinda Kenner, MS, CCC-SLP Watson,Katherine 03/13/2017,11:04 AM

## 2017-03-13 NOTE — Discharge Summary (Signed)
[facility] [place] NAME: Elijah Evans    MR#:  [id number]  DATE OF BIRTH:  1945/12/01  DATE OF ADMISSION:  03/11/2017 ADMITTING PHYSICIAN: Henreitta Leber, MD  DATE OF DISCHARGE: 03/13/2017  PRIMARY CARE PHYSICIAN: [place]   ADMISSION DIAGNOSIS:  Dilantin toxicity, accidental or unintentional, initial encounter [T42.0X1A] Altered mental status, unspecified altered mental status type [R41.82]  DISCHARGE DIAGNOSIS:  Active Problems:   Dilantin toxicity   SECONDARY DIAGNOSIS:   Past Medical History:  Diagnosis Date  . Arthritis    osteo - pelvic area  . Dementia    early stages  . Hypertension   . Left leg weakness    S/P TIA  . Motion sickness    cars  . Seizures ([place])   . Shortness of breath dyspnea   . Stroke ([place])   . TIA (transient ischemic attack)    X3 - last one approx 2012  . Wears dentures    full upper and lower     ADMITTING HISTORY  HISTORY OF PRESENT ILLNESS:  Elijah Evans  is a 71 y.o. male with a known history of Dementia, previous history of CVA, hypertension, osteoarthritis, COPD with ongoing tobacco abuse who presents to the hospital due to altered mental status. Patient himself given his dementia and altered mental status cannot get a clear history definite most of the history obtained from the chart and also from the granddaughter over the phone. As per the granddaughter patient has had witnessed seizures at the assisted living over the past 2-3 days. Patient is already on antiepileptics including Dilantin and Tegretol. Seems like patient's Dilantin dose was increased given his breakthrough seizures. Patient presented to the ER as his mental status was getting worse and in the ER patient's Dilantin level was over 30. Hospitalist services were contacted further treatment and evaluation.   HOSPITAL COURSE:   * Acute toxic encephalopathy POA Due to dilantin toxicity Dilantin  stopped Confusion has resolved     CT head, xray chest and UA are negative.  * seizures   Cont tegretol , added vimpa in place of dilantin  * pneumonia vs bronchitis- diagnosed and started on doxy 50 mg bid during ER visit on 03/09/17.    Chest x ray appears clear Finish abx course as before    * hypertension   Cont home meds.  Stable for discharge  CONSULTS OBTAINED:  Treatment Team:  Leotis Pain, MD  DRUG ALLERGIES:  No Known Allergies  DISCHARGE MEDICATIONS:   Current Discharge Medication List    START taking these medications   Details  lacosamide 100 MG TABS Take 1 tablet (100 mg total) by mouth 2 (two) times daily. Qty: 60 tablet, Refills: 0      CONTINUE these medications which have NOT CHANGED   Details  acetaminophen (TYLENOL) 325 MG tablet Take 2 tablets (650 mg total) by mouth every 4 (four) hours as needed for mild pain, moderate pain or headache. Qty: 30 tablet, Refills: 3    albuterol (PROVENTIL HFA;VENTOLIN HFA) 108 (90 Base) MCG/ACT inhaler Inhale 2 puffs into the lungs every 6 (six) hours as needed for wheezing or shortness of breath. Qty: 1 Inhaler, Refills: 2    carbamazepine (TEGRETOL) 200 MG tablet Take 400 mg by mouth 2 (two) times daily.    dipyridamole-aspirin (AGGRENOX) 200-25 MG 12hr capsule Take 1 capsule by mouth 2 (two) times daily.    doxycycline (VIBRAMYCIN) 50 MG capsule Take  2 capsules (100 mg total) by mouth 2 (two) times daily. Qty: 40 capsule, Refills: 0    fluticasone (FLONASE) 50 MCG/ACT nasal spray Place into both nostrils daily.    losartan-hydrochlorothiazide (HYZAAR) 100-12.5 MG tablet Take 0.5 tablets by mouth daily.     magnesium hydroxide (MILK OF MAGNESIA) 400 MG/5ML suspension Take 30 mLs by mouth daily as needed for moderate constipation.    meclizine (ANTIVERT) 32 MG tablet Take 1 tablet (32 mg total) by mouth 3 (three) times daily as needed. Qty: 30 tablet, Refills: 0    ondansetron (ZOFRAN ODT) 4 MG  disintegrating tablet Take 1 tablet (4 mg total) by mouth every 8 (eight) hours as needed for nausea or vomiting. Qty: 20 tablet, Refills: 0    simvastatin (ZOCOR) 40 MG tablet Take 40 mg by mouth at bedtime.    vitamin B-12 (CYANOCOBALAMIN) 1000 MCG tablet Take 1,000 mcg by mouth daily.      STOP taking these medications     phenytoin (DILANTIN) 100 MG ER capsule      phenytoin (DILANTIN) 30 MG ER capsule      Na Sulfate-K Sulfate-Mg Sulf (SUPREP BOWEL PREP) SOLN      polyethylene glycol (GOLYTELY) 236 g solution         Today   VITAL SIGNS:  Blood pressure 140/68, pulse 71, temperature 98.7 F (37.1 C), temperature source Oral, resp. rate 16, height 6' (1.829 m), weight 75.1 kg (165 lb 9.6 oz), SpO2 98 %.  I/O:   Intake/Output Summary (Last 24 hours) at 03/13/17 1011 Last data filed at 03/13/17 0800  Gross per 24 hour  Intake              120 ml  Output               50 ml  Net               70 ml    PHYSICAL EXAMINATION:  Physical Exam  GENERAL:  71 y.o.-year-old patient lying in the bed with no acute distress.  LUNGS: Normal breath sounds bilaterally, no wheezing, rales,rhonchi or crepitation. No use of accessory muscles of respiration.  CARDIOVASCULAR: S1, S2 normal. No murmurs, rubs, or gallops.  ABDOMEN: Soft, non-tender, non-distended. Bowel sounds present. No organomegaly or mass.  PSYCHIATRIC: The patient is alert and awake  DATA REVIEW:   CBC  Recent Labs Lab 03/12/17 0449  WBC 6.1  HGB 12.1*  HCT 35.6*  PLT 248    Chemistries   Recent Labs Lab 03/09/17 1520  03/12/17 0449  NA 134*  < > 132*  K 3.3*  < > 3.4*  CL 94*  < > 100*  CO2 29  < > 27  GLUCOSE 130*  < > 101*  BUN 14  < > 14  CREATININE 0.74  < > 0.60*  CALCIUM 8.5*  < > 8.0*  AST 28  --   --   ALT 23  --   --   ALKPHOS 143*  --   --   BILITOT 0.6  --   --   < > = values in this interval not displayed.  Cardiac Enzymes  Recent Labs Lab 03/11/17 1426  TROPONINI <0.03     Microbiology Results  Results for orders placed or performed during the hospital encounter of 10/24/16  Urine culture     Status: None   Collection Time: 10/24/16 12:18 PM  Result Value Ref Range Status   Specimen Description URINE,  RANDOM  Final   Special Requests NONE  Final   Culture NO GROWTH Performed at [facility]   Final   Report Status 10/25/2016 FINAL  Final  Blood culture (routine x 2)     Status: None   Collection Time: 10/24/16 12:19 PM  Result Value Ref Range Status   Specimen Description BLOOD RIGHT AC  Final   Special Requests   Final    BOTTLES DRAWN AEROBIC AND ANAEROBIC AER 17ML ANA 14ML   Culture NO GROWTH 5 DAYS  Final   Report Status 10/29/2016 FINAL  Final  Blood culture (routine x 2)     Status: None   Collection Time: 10/24/16 12:19 PM  Result Value Ref Range Status   Specimen Description BLOOD LEFT HAND  Final   Special Requests   Final    BOTTLES DRAWN AEROBIC AND ANAEROBIC AER 9ML ANA 12ML   Culture NO GROWTH 5 DAYS  Final   Report Status 10/29/2016 FINAL  Final    RADIOLOGY:  Dg Chest 2 View  Result Date: 03/11/2017 CLINICAL DATA:  Seizure today, former smoker, TIA, stroke. dementia EXAM: CHEST  2 VIEW COMPARISON:  03/09/2017 FINDINGS: Both lateral views are degraded by patient arm position, not raised above the head. Midline trachea. Mild cardiomegaly. Atherosclerosis in the transverse aorta. No pleural effusion or pneumothorax. right upper lobe calcified granuloma. IMPRESSION: No acute cardiopulmonary disease. Cardiomegaly without congestive failure. Aortic atherosclerosis. Electronically Signed   By: Abigail Miyamoto M.D.   On: 03/11/2017 15:11   Ct Head Wo Contrast  Result Date: 03/11/2017 CLINICAL DATA:  Seizures.  Dementia. EXAM: CT HEAD WITHOUT CONTRAST TECHNIQUE: Contiguous axial images were obtained from the base of the skull through the vertex without intravenous contrast. COMPARISON:  03/06/2017 brain MR and CT. FINDINGS: Brain:  Advanced cerebral atrophy. Moderate low density in the periventricular white matter likely related to small vessel disease. Right basal ganglia lacunar infarct versus dilated perivascular space. No mass lesion, hemorrhage, hydrocephalus, acute infarct, intra-axial, or extra-axial fluid collection. Vascular: Intracranial atherosclerosis. Skull: Normal Sinuses/Orbits: Normal imaged portions of the orbits and globes. Cerumen in the left external ear canal. Bilateral middle ear and mastoid air cell opacification. Other: None. IMPRESSION: 1.  No acute intracranial abnormality. 2.  Cerebral atrophy and small vessel ischemic change. 3. Bilateral mastoid and middle ear fluid. Electronically Signed   By: Abigail Miyamoto M.D.   On: 03/11/2017 15:16   Dg Chest Port 1 View  Result Date: 03/12/2017 CLINICAL DATA:  Raspy voice EXAM: PORTABLE CHEST 1 VIEW COMPARISON:  03/11/2017 chest radiograph. FINDINGS: Stable cardiomediastinal silhouette with normal heart size and aortic atherosclerosis. No pneumothorax. No pleural effusion. No pulmonary edema. Stable subcentimeter granuloma in the right mid lung. No acute consolidative airspace disease. IMPRESSION: No active disease.  Aortic atherosclerosis. Electronically Signed   By: Ilona Sorrel M.D.   On: 03/12/2017 18:39    Follow up with PCP in 1 week.  Management plans discussed with the patient, family and they are in agreement.  CODE STATUS:     Code Status Orders        Start     Ordered   03/11/17 1735  Full code  Continuous     03/11/17 1734    Code Status History    Date Active Date Inactive Code Status Order ID Comments User Context   12/08/2015  6:53 PM 12/12/2015  4:46 PM Full Code [id number]  Jules Husbands, MD ED    Advance Directive  Documentation     Most Recent Value  Type of Advance Directive  Healthcare Power of Attorney  Pre-existing out of facility DNR order (yellow form or pink MOST form)  -  "MOST" Form in Place?  -      TOTAL TIME TAKING  CARE OF THIS PATIENT ON DAY OF DISCHARGE: more than 30 minutes.   Hillary Bow R M.D on 03/13/2017 at 10:11 AM  Between 7am to 6pm - Pager - [phone]  After 6pm go to [email or web address] - password [email or web address] [facility]  Office  [phone]  CC: Primary care physician; [facility]  Note: This dictation was prepared with Dragon dictation along with smaller phrase technology. Any transcriptional errors that result from this process are unintentional.

## 2017-04-21 ENCOUNTER — Emergency Department: Payer: Medicare Other

## 2017-04-21 ENCOUNTER — Inpatient Hospital Stay
Admission: EM | Admit: 2017-04-21 | Discharge: 2017-04-25 | DRG: 872 | Disposition: A | Discharge status: Skilled Nursing Facility | Payer: Medicare Other | Attending: Specialist | Admitting: Specialist

## 2017-04-21 DIAGNOSIS — I69344 Monoplegia of lower limb following cerebral infarction affecting left non-dominant side: Secondary | ICD-10-CM | POA: Diagnosis not present

## 2017-04-21 DIAGNOSIS — A419 Sepsis, unspecified organism: Secondary | ICD-10-CM | POA: Diagnosis not present

## 2017-04-21 DIAGNOSIS — M1990 Unspecified osteoarthritis, unspecified site: Secondary | ICD-10-CM | POA: Diagnosis present

## 2017-04-21 DIAGNOSIS — Z8249 Family history of ischemic heart disease and other diseases of the circulatory system: Secondary | ICD-10-CM | POA: Diagnosis not present

## 2017-04-21 DIAGNOSIS — G40909 Epilepsy, unspecified, not intractable, without status epilepticus: Secondary | ICD-10-CM | POA: Diagnosis present

## 2017-04-21 DIAGNOSIS — I10 Essential (primary) hypertension: Secondary | ICD-10-CM | POA: Diagnosis present

## 2017-04-21 DIAGNOSIS — B9620 Unspecified Escherichia coli [E. coli] as the cause of diseases classified elsewhere: Secondary | ICD-10-CM | POA: Diagnosis present

## 2017-04-21 DIAGNOSIS — B9689 Other specified bacterial agents as the cause of diseases classified elsewhere: Secondary | ICD-10-CM | POA: Diagnosis present

## 2017-04-21 DIAGNOSIS — F17210 Nicotine dependence, cigarettes, uncomplicated: Secondary | ICD-10-CM | POA: Diagnosis present

## 2017-04-21 DIAGNOSIS — R109 Unspecified abdominal pain: Secondary | ICD-10-CM | POA: Diagnosis present

## 2017-04-21 DIAGNOSIS — E669 Obesity, unspecified: Secondary | ICD-10-CM | POA: Diagnosis present

## 2017-04-21 DIAGNOSIS — F0390 Unspecified dementia without behavioral disturbance: Secondary | ICD-10-CM | POA: Diagnosis present

## 2017-04-21 DIAGNOSIS — K529 Noninfective gastroenteritis and colitis, unspecified: Secondary | ICD-10-CM | POA: Diagnosis present

## 2017-04-21 DIAGNOSIS — R569 Unspecified convulsions: Secondary | ICD-10-CM

## 2017-04-21 DIAGNOSIS — Z79899 Other long term (current) drug therapy: Secondary | ICD-10-CM | POA: Diagnosis not present

## 2017-04-21 DIAGNOSIS — Z823 Family history of stroke: Secondary | ICD-10-CM | POA: Diagnosis not present

## 2017-04-21 DIAGNOSIS — E785 Hyperlipidemia, unspecified: Secondary | ICD-10-CM | POA: Diagnosis present

## 2017-04-21 DIAGNOSIS — I959 Hypotension, unspecified: Secondary | ICD-10-CM

## 2017-04-21 DIAGNOSIS — B961 Klebsiella pneumoniae [K. pneumoniae] as the cause of diseases classified elsewhere: Secondary | ICD-10-CM | POA: Diagnosis present

## 2017-04-21 LAB — CBC
HEMATOCRIT: 34.7 % — AB (ref 40.0–52.0)
Hemoglobin: 11.9 g/dL — ABNORMAL LOW (ref 13.0–18.0)
MCH: 31.5 pg (ref 26.0–34.0)
MCHC: 34.2 g/dL (ref 32.0–36.0)
MCV: 92.1 fL (ref 80.0–100.0)
Platelets: 237 10*3/uL (ref 150–440)
RBC: 3.76 MIL/uL — ABNORMAL LOW (ref 4.40–5.90)
RDW: 14.9 % — AB (ref 11.5–14.5)
WBC: 16.4 10*3/uL — ABNORMAL HIGH (ref 3.8–10.6)

## 2017-04-21 LAB — COMPREHENSIVE METABOLIC PANEL
ALK PHOS: 263 U/L — AB (ref 38–126)
ALT: 127 U/L — AB (ref 17–63)
AST: 203 U/L — AB (ref 15–41)
Albumin: 3.1 g/dL — ABNORMAL LOW (ref 3.5–5.0)
Anion gap: 9 (ref 5–15)
BILIRUBIN TOTAL: 3.2 mg/dL — AB (ref 0.3–1.2)
BUN: 15 mg/dL (ref 6–20)
CALCIUM: 8.5 mg/dL — AB (ref 8.9–10.3)
CO2: 24 mmol/L (ref 22–32)
CREATININE: 0.87 mg/dL (ref 0.61–1.24)
Chloride: 98 mmol/L — ABNORMAL LOW (ref 101–111)
GFR calc Af Amer: >60 mL/min (ref >60)
GFR calc non Af Amer: >60 mL/min (ref >60)
GLUCOSE: 118 mg/dL — AB (ref 65–99)
Potassium: 3.6 mmol/L (ref 3.5–5.1)
Sodium: 131 mmol/L — ABNORMAL LOW (ref 135–145)
TOTAL PROTEIN: 5.9 g/dL — AB (ref 6.5–8.1)

## 2017-04-21 LAB — URINALYSIS, ROUTINE W REFLEX MICROSCOPIC
BILIRUBIN URINE: NEGATIVE
Glucose, UA: NEGATIVE mg/dL
Hgb urine dipstick: NEGATIVE
KETONES UR: NEGATIVE mg/dL
Leukocytes, UA: NEGATIVE
NITRITE: NEGATIVE
PH: 7 (ref 5.0–8.0)
Protein, ur: NEGATIVE mg/dL
Specific Gravity, Urine: 1.018 (ref 1.005–1.030)

## 2017-04-21 LAB — LIPASE, BLOOD: Lipase: 23 U/L (ref 11–51)

## 2017-04-21 LAB — TROPONIN I
TROPONIN I: 0.05 ng/mL — AB (ref <0.03)
Troponin I: 0.06 ng/mL (ref <0.03)
Troponin I: 0.04 ng/mL (ref <0.03)

## 2017-04-21 LAB — LACTIC ACID, PLASMA
LACTIC ACID, VENOUS: 1.4 mmol/L (ref 0.5–1.9)
Lactic Acid, Venous: 1.4 mmol/L (ref 0.5–1.9)

## 2017-04-21 MED ORDER — IOPAMIDOL (ISOVUE-300) INJECTION 61%
30.0000 mL | Freq: Once | INTRAVENOUS | Status: AC | PRN
  Administered 2017-04-21: 30 mL via ORAL

## 2017-04-21 MED ORDER — SODIUM CHLORIDE 0.9 % IV BOLUS (SEPSIS)
1000.0000 mL | Freq: Once | INTRAVENOUS | Status: AC
  Administered 2017-04-21: 1000 mL via INTRAVENOUS

## 2017-04-21 MED ORDER — SODIUM CHLORIDE 0.9 % IV BOLUS (SEPSIS): 1000.0000 mL | Freq: Once | INTRAVENOUS | Status: DC

## 2017-04-21 MED ORDER — VANCOMYCIN HCL IN DEXTROSE 1-5 GM/200ML-% IV SOLN: 1000.0000 mg | Freq: Two times a day (BID) | INTRAVENOUS | Status: DC

## 2017-04-21 MED ORDER — ONDANSETRON HCL 4 MG PO TABS: 4.0000 mg | ORAL_TABLET | Freq: Four times a day (QID) | ORAL | Status: DC | PRN

## 2017-04-21 MED ORDER — SIMVASTATIN 40 MG PO TABS
40.0000 mg | ORAL_TABLET | Freq: Every day | ORAL | Status: DC
  Administered 2017-04-22 – 2017-04-24 (×3): 40 mg via ORAL
  Filled 2017-04-21 (×3): qty 1

## 2017-04-21 MED ORDER — PIPERACILLIN-TAZOBACTAM 3.375 G IVPB: 3.3750 g | Freq: Three times a day (TID) | INTRAVENOUS | Status: DC

## 2017-04-21 MED ORDER — LACOSAMIDE 50 MG PO TABS
100.0000 mg | ORAL_TABLET | Freq: Two times a day (BID) | ORAL | Status: DC
  Administered 2017-04-22 – 2017-04-25 (×7): 100 mg via ORAL
  Filled 2017-04-21 (×9): qty 2

## 2017-04-21 MED ORDER — ACETAMINOPHEN 650 MG RE SUPP: 650.0000 mg | Freq: Four times a day (QID) | RECTAL | Status: DC | PRN

## 2017-04-21 MED ORDER — ONDANSETRON HCL 4 MG/2ML IJ SOLN: 4.0000 mg | Freq: Four times a day (QID) | INTRAMUSCULAR | Status: DC | PRN

## 2017-04-21 MED ORDER — ENOXAPARIN SODIUM 40 MG/0.4ML ~~LOC~~ SOLN
40.0000 mg | SUBCUTANEOUS | Status: DC
  Administered 2017-04-22 – 2017-04-24 (×4): 40 mg via SUBCUTANEOUS
  Filled 2017-04-21 (×4): qty 0.4

## 2017-04-21 MED ORDER — PIPERACILLIN-TAZOBACTAM 3.375 G IVPB 30 MIN
3.3750 g | Freq: Once | INTRAVENOUS | Status: AC
  Administered 2017-04-21: 3.375 g via INTRAVENOUS
  Filled 2017-04-21: qty 50

## 2017-04-21 MED ORDER — ACETAMINOPHEN 325 MG PO TABS: 650.0000 mg | ORAL_TABLET | Freq: Four times a day (QID) | ORAL | Status: DC | PRN

## 2017-04-21 MED ORDER — IOPAMIDOL (ISOVUE-300) INJECTION 61%
100.0000 mL | Freq: Once | INTRAVENOUS | Status: AC | PRN
  Administered 2017-04-21: 100 mL via INTRAVENOUS

## 2017-04-21 MED ORDER — SODIUM CHLORIDE 0.9 % IV BOLUS (SEPSIS)
250.0000 mL | Freq: Once | INTRAVENOUS | Status: AC
  Administered 2017-04-21: 250 mL via INTRAVENOUS

## 2017-04-21 MED ORDER — VANCOMYCIN HCL IN DEXTROSE 1-5 GM/200ML-% IV SOLN
1000.0000 mg | Freq: Once | INTRAVENOUS | Status: AC
  Administered 2017-04-21: 1000 mg via INTRAVENOUS
  Filled 2017-04-21: qty 200

## 2017-04-21 MED ORDER — CARBAMAZEPINE 200 MG PO TABS
400.0000 mg | ORAL_TABLET | Freq: Two times a day (BID) | ORAL | Status: DC
  Administered 2017-04-22 – 2017-04-25 (×8): 400 mg via ORAL
  Filled 2017-04-21 (×10): qty 2

## 2017-04-21 MED ORDER — SODIUM CHLORIDE 0.9 % IV SOLN
INTRAVENOUS | Status: AC
  Administered 2017-04-22: 01:00:00 via INTRAVENOUS

## 2017-04-21 MED ORDER — ASPIRIN-DIPYRIDAMOLE ER 25-200 MG PO CP12
1.0000 | ORAL_CAPSULE | Freq: Two times a day (BID) | ORAL | Status: DC
  Administered 2017-04-22 – 2017-04-25 (×7): 1 via ORAL
  Filled 2017-04-21 (×7): qty 1

## 2017-04-21 MED ORDER — SODIUM CHLORIDE 0.9 % IV SOLN
Freq: Once | INTRAVENOUS | Status: AC
  Administered 2017-04-21: 1000 mL via INTRAVENOUS

## 2017-04-21 NOTE — H&P (Signed)
[facility] [place] NAME: Elijah Evans    MR#:  [id number]  DATE OF BIRTH:  [birth year]  DATE OF ADMISSION:  04/21/2017  PRIMARY CARE PHYSICIAN: [facility]   REQUESTING/REFERRING PHYSICIAN: Kerman Passey, MD  CHIEF COMPLAINT:   Chief Complaint  Patient presents with  . Abdominal Pain    HISTORY OF PRESENT ILLNESS:  Elijah Evans  is a 71 y.o. male who presents with left upper quadrant abdominal pain.    PAST MEDICAL HISTORY:   Past Medical History:  Diagnosis Date  . Arthritis    osteo - pelvic area  . Dementia    early stages  . Hypertension   . Left leg weakness    S/P TIA  . Motion sickness    cars  . Seizures ([place])   . Shortness of breath dyspnea   . Stroke ([place])   . TIA (transient ischemic attack)    X3 - last one approx 2012  . Wears dentures    full upper and lower    PAST SURGICAL HISTORY:   Past Surgical History:  Procedure Laterality Date  . FOOT SURGERY    . TONSILLECTOMY      SOCIAL HISTORY:   Social History  Substance Use Topics  . Smoking status: Current Every Day Smoker    Packs/day: 2.00    Years: 50.00    Types: Cigarettes  . Smokeless tobacco: Never Used     Comment: In care facility as of 12/11/15 - smokes only 4-5 cigs/day there  . Alcohol use No     Comment: Used to drink a lot but quit 5 years ago.    FAMILY HISTORY:   Family History  Problem Relation Age of Onset  . CAD Mother   . Stroke Mother   . CAD Father   . Stroke Father     DRUG ALLERGIES:  No Known Allergies  MEDICATIONS AT HOME:   Prior to Admission medications   Medication Sig Start Date End Date Taking? Authorizing Provider  acetaminophen (TYLENOL) 325 MG tablet Take 2 tablets (650 mg total) by mouth every 4 (four) hours as needed for mild pain, moderate pain or headache. 12/12/15  Yes Loflin, Lavona Mound, MD  albuterol (PROVENTIL HFA;VENTOLIN HFA) 108 (90 Base) MCG/ACT inhaler Inhale 2  puffs into the lungs every 6 (six) hours as needed for wheezing or shortness of breath. 03/09/17  Yes Merlyn Lot, MD  carbamazepine (TEGRETOL) 200 MG tablet Take 400 mg by mouth 2 (two) times daily.   Yes [provider]  dipyridamole-aspirin (AGGRENOX) 200-25 MG 12hr capsule Take 1 capsule by mouth 2 (two) times daily.   Yes [provider]  fluticasone (FLONASE) 50 MCG/ACT nasal spray Place into both nostrils daily.   Yes [provider]  lacosamide 100 MG TABS Take 1 tablet (100 mg total) by mouth 2 (two) times daily. 03/13/17  Yes Sudini, Alveta Heimlich, MD  losartan-hydrochlorothiazide (HYZAAR) 100-12.5 MG tablet Take 0.5 tablets by mouth daily.    Yes [provider]  magnesium hydroxide (MILK OF MAGNESIA) 400 MG/5ML suspension Take 30 mLs by mouth daily as needed for moderate constipation.   Yes [provider]  meclizine (ANTIVERT) 32 MG tablet Take 1 tablet (32 mg total) by mouth 3 (three) times daily as needed. 03/06/17  Yes Darel Hong, MD  ondansetron (ZOFRAN ODT) 4 MG disintegrating tablet Take 1 tablet (4 mg total) by mouth every 8 (eight) hours as needed for  nausea or vomiting. 03/06/17  Yes Darel Hong, MD  simvastatin (ZOCOR) 40 MG tablet Take 40 mg by mouth at bedtime.   Yes [provider]  vitamin B-12 (CYANOCOBALAMIN) 1000 MCG tablet Take 1,000 mcg by mouth daily.   Yes [provider]    REVIEW OF SYSTEMS:  Review of Systems  Constitutional: Negative for chills, fever, malaise/fatigue and weight loss.  HENT: Negative for ear pain, hearing loss and tinnitus.   Eyes: Negative for blurred vision, double vision, pain and redness.  Respiratory: Negative for cough, hemoptysis and shortness of breath.   Cardiovascular: Negative for chest pain, palpitations, orthopnea and leg swelling.  Gastrointestinal: Positive for abdominal pain. Negative for constipation, diarrhea, nausea and vomiting.  Genitourinary: Negative for  dysuria, frequency and hematuria.  Musculoskeletal: Negative for back pain, joint pain and neck pain.  Skin:       No acne, rash, or lesions  Neurological: Negative for dizziness, tremors, focal weakness and weakness.  Endo/Heme/Allergies: Negative for polydipsia. Does not bruise/bleed easily.  Psychiatric/Behavioral: Negative for depression. The patient is not nervous/anxious and does not have insomnia.      VITAL SIGNS:   Vitals:   04/21/17 1721 04/21/17 1730 04/21/17 1830 04/21/17 1930  BP:  (!) 101/56 (!) 101/50 112/67  Pulse: 96 94  92  Resp: 20 (!) 21 20 (!) 23  Temp:      TempSrc:      SpO2: 95% 95%  97%  Weight:      Height:       Wt Readings from Last 3 Encounters:  04/21/17 70.3 kg (155 lb)  03/11/17 75.1 kg (165 lb 9.6 oz)  03/09/17 77.1 kg (170 lb)    PHYSICAL EXAMINATION:  Physical Exam  Vitals reviewed. Constitutional: He is oriented to person, place, and time. He appears well-developed and well-nourished. No distress.  HENT:  Head: Normocephalic and atraumatic.  Mouth/Throat: Oropharynx is clear and moist.  Eyes: Conjunctivae and EOM are normal. Pupils are equal, round, and reactive to light. No scleral icterus.  Neck: Normal range of motion. Neck supple. No JVD present. No thyromegaly present.  Cardiovascular: Normal rate, regular rhythm and intact distal pulses.  Exam reveals no gallop and no friction rub.   No murmur heard. Respiratory: Effort normal and breath sounds normal. No respiratory distress. He has no wheezes. He has no rales.  GI: Soft. Bowel sounds are normal. He exhibits no distension. There is tenderness.  Musculoskeletal: Normal range of motion. He exhibits no edema.  No arthritis, no gout  Lymphadenopathy:    He has no cervical adenopathy.  Neurological: He is alert and oriented to person, place, and time. No cranial nerve deficit.  No dysarthria, no aphasia  Skin: Skin is warm and dry. No rash noted. No erythema.  Psychiatric: He has a  normal mood and affect. His behavior is normal. Judgment and thought content normal.    LABORATORY PANEL:   CBC  Recent Labs Lab 04/21/17 1344  WBC 16.4*  HGB 11.9*  HCT 34.7*  PLT 237   ------------------------------------------------------------------------------------------------------------------  Chemistries   Recent Labs Lab 04/21/17 1344  NA 131*  K 3.6  CL 98*  CO2 24  GLUCOSE 118*  BUN 15  CREATININE 0.87  CALCIUM 8.5*  AST 203*  ALT 127*  ALKPHOS 263*  BILITOT 3.2*   ------------------------------------------------------------------------------------------------------------------  Cardiac Enzymes  Recent Labs Lab 04/21/17 1845  TROPONINI 0.05*   ------------------------------------------------------------------------------------------------------------------  RADIOLOGY:  Ct Abdomen Pelvis W Contrast  Result Date:  04/21/2017 CLINICAL DATA:  Diffuse abdominal pain EXAM: CT ABDOMEN AND PELVIS WITH CONTRAST TECHNIQUE: Multidetector CT imaging of the abdomen and pelvis was performed using the standard protocol following bolus administration of intravenous contrast. CONTRAST:  169mL ISOVUE-300 IOPAMIDOL (ISOVUE-300) INJECTION 61% COMPARISON:  12/08/2015 FINDINGS: Lower chest:  No acute finding Hepatobiliary: 1 cm hypervascular focus in the upper right liver is stable from 2017, likely a flash fill hemangioma or small shunt.No evidence of biliary obstruction or stone. Pancreas: Unremarkable. Spleen: Unremarkable. Adrenals/Urinary Tract: Thickened left adrenal glands without discrete mass, stable. No hydronephrosis or stone. Simple appearing bilateral renal cysts. Unremarkable bladder. Stomach/Bowel: No obstruction or convincing inflammation. Chronic soft tissue contiguous with ileal mesenteric fat, where there is also prominent nodes and distortion of mesenteric vessels. The associated fat appears hypertrophic with greater small bowel spacing. This appearance is  nonprogressive and presumably postinflammatory. High-density material within the ileum and proximal colon is presumably ingested; there is no history of GI bleeding. Vascular/Lymphatic: Confluent atherosclerotic plaque. No retroperitoneal adenopathy or mass. Reproductive:No pathologic findings. Other: No ascites or pneumoperitoneum.  Fatty left inguinal hernia. Musculoskeletal: Stable generalized bony demineralization and heterogeneity. No acute or focal aggressive finding. IMPRESSION: 1. No acute finding when compared to January 2017. 2. 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 Is there history of prior inflammatory bowel disease? 3.  Aortic Atherosclerosis (ICD10-I70.0), extensive. 4. Fatty left inguinal hernia. 5. Stable bony demineralization and heterogeneity. Electronically Signed   By: Monte Fantasia M.D.   On: 04/21/2017 17:17   Dg Chest Port 1 View  Result Date: 04/21/2017 CLINICAL DATA:  Cough and sepsis EXAM: PORTABLE CHEST 1 VIEW COMPARISON:  03/12/2017 FINDINGS: Borderline cardiomegaly that is stable. Stable mediastinal contours. Chronic prominent markings in the right infrahilar lung. There is generalized interstitial coarsening from COPD. Calcified granuloma in the right lung. No effusion, air bronchogram, or pneumothorax. IMPRESSION: COPD without acute superimposed finding. Electronically Signed   By: Monte Fantasia M.D.   On: 04/21/2017 14:18   US Abdomen Limited Ruq  Result Date: 04/21/2017 CLINICAL DATA:  Abdominal pain, elevated liver function tests. EXAM: ULTRASOUND ABDOMEN LIMITED RIGHT UPPER QUADRANT COMPARISON:  None. FINDINGS: Gallbladder: The gallbladder is not visualized. Common bile duct: Diameter: 6.2 mm. Liver: No focal lesion identified. Within normal limits in parenchymal echogenicity. Trace amount of free fluid adjacent to the liver. Incidental finding of superior right renal cyst with benign appearance measuring 1.9 x 2.0 x 2.3 cm. IMPRESSION:  The gallbladder is not visualized, which may be due to prior cholecystectomy or completely collapsed gallbladder. No evidence of dilation of intra or extrahepatic bile ducts. Trace amount of perihepatic ascites. Electronically Signed   By: Fidela Salisbury M.D.   On: 04/21/2017 16:44    EKG:   Orders placed or performed during the hospital encounter of 04/21/17  . EKG 12-Lead  . EKG 12-Lead  . EKG 12-Lead  . EKG 12-Lead    IMPRESSION AND PLAN:  Principal Problem:   Sepsis ([place]) - IV antibiotics, lactic acid Within normal limits, patient's blood pressure low end normal to borderline hypotensive but improving with IV fluids. Suspect due to colitis given his abdominal pain Active Problems:   Abdominal pain - IV antibiotics as above, keep IV fluids and place, when necessary analgesia   HTN (hypertension) - hold antihypertensives for now as the patient is borderline hypotensive   Seizures ([place]) - continue home meds  All the records are reviewed and case discussed with ED provider. Management plans  discussed with the patient and/or family.  DVT PROPHYLAXIS: SubQ lovenox  GI PROPHYLAXIS: None  ADMISSION STATUS: Inpatient  CODE STATUS: Full Code Status History    Date Active Date Inactive Code Status Order ID Comments User Context   03/11/2017  5:34 PM 03/13/2017  8:12 PM Full Code [id number]  Henreitta Leber, MD ED   12/08/2015  6:53 PM 12/12/2015  4:46 PM Full Code [id number]  Jules Husbands, MD ED      TOTAL TIME TAKING CARE OF THIS PATIENT: 45 minutes.   Sloka Volante FIELDING 04/21/2017, 9:05 PM  [facility]  Office  [phone]  CC: Primary care physician; [facility]  Note:  This document was prepared using Dragon voice recognition software and may include unintentional dictation errors.

## 2017-04-21 NOTE — Progress Notes (Signed)
Pharmacy Antibiotic Note  Elijah Evans is a 71 y.o. male admitted on 04/21/2017 with sepsis.  Pharmacy has been consulted for vancomycin and Zosyn dosing.Patient admitted with LUQ abdominal pain from the [place]. Patient received vancomycin and Zosyn x 1 ED.   Plan: Will initiate patient on vancomycin 1g IV Q12hr for goal trough of 15-20. Will obtain trough prior to am dose on 6/16. Recommend considering utility of vancomycin in setting of LUQ pain.   Will initiate Zosyn EI 3.375g IV Q8hr.   Height: 6\' 1"  (185.4 cm) Weight: 155 lb (70.3 kg) IBW/kg (Calculated) : 79.9  Temp (24hrs), Avg:99.2 F (37.3 C), Min:99.2 F (37.3 C), Max:99.2 F (37.3 C)   Recent Labs Lab 04/21/17 1344  WBC 16.4*  CREATININE 0.87  LATICACIDVEN 1.4    Estimated Creatinine Clearance: 75.2 mL/min (by C-G formula based on SCr of 0.87 mg/dL).    No Known Allergies  Antimicrobials this admission: Vancomycin 6/15 >>  Zosyn 6/15 >>   Dose adjustments this admission: N/A  Microbiology results: 6/15 BCx: pending  6/15 UCx: pending    Thank you for allowing pharmacy to be a part of this patient's care.  Charletha Dalpe L 04/21/2017 2:40 PM

## 2017-04-21 NOTE — ED Notes (Signed)
ED Provider at bedside. 

## 2017-04-21 NOTE — ED Provider Notes (Signed)
CT scan and ultrasound without obvious etiology of the patient's symptoms. Repeat troponin and was somewhat improved at 0.05. Patient however started having hypotension again. Will plan on admission to hospital service for further workup and management.   Nance Pear, MD 04/21/17 2023

## 2017-04-21 NOTE — ED Notes (Signed)
Date and time results received: 04/21/17 2045  Lab did not call me for critical result  Test: Troponin Critical Value: 0.05  Name of Provider Notified: Archie Balboa  Orders Received? Or Actions Taken?: None

## 2017-04-21 NOTE — ED Provider Notes (Addendum)
[facility] Emergency Department Provider Note  Time seen: 2:39 PM  I have reviewed the triage vital signs and the nursing notes.   HISTORY  Chief Complaint Abdominal Pain    HPI Elijah Evans is a 71 y.o. male with a past medical history of dementia, hypertension, seizures, CVA, presents to the emergency departmentfor left upper quadrant abdominal pain. Currently the patient is awake, alert he's oriented 4. He states he has been experiencing abdominal pain today points to the upper abdomen. He denies any fever, nausea vomiting or diarrhea. Patient states occasional cough. Denies any chest pain or shortness of breath. Denies any dysuria. Patient does have mild dementia so his history May be limited however he is oriented 4 at this time. Describes his abdominal pain is moderate aching-type pain in the upper abdomen  Past Medical History:  Diagnosis Date  . Arthritis    osteo - pelvic area  . Dementia    early stages  . Hypertension   . Left leg weakness    S/P TIA  . Motion sickness    cars  . Seizures ([place])   . Shortness of breath dyspnea   . Stroke ([place])   . TIA (transient ischemic attack)    X3 - last one approx 2012  . Wears dentures    full upper and lower    Patient Active Problem List   Diagnosis Date Noted  . Dilantin toxicity 03/11/2017  . SBO (small bowel obstruction) ([place])   . Small bowel obstruction ([place]) 12/08/2015    Past Surgical History:  Procedure Laterality Date  . FOOT SURGERY    . TONSILLECTOMY      Prior to Admission medications   Medication Sig Start Date End Date Taking? Authorizing Provider  acetaminophen (TYLENOL) 325 MG tablet Take 2 tablets (650 mg total) by mouth every 4 (four) hours as needed for mild pain, moderate pain or headache. 12/12/15  Yes Loflin, Lavona Mound, MD  albuterol (PROVENTIL HFA;VENTOLIN HFA) 108 (90 Base) MCG/ACT inhaler Inhale 2 puffs into the lungs every 6 (six) hours as needed for  wheezing or shortness of breath. 03/09/17  Yes Merlyn Lot, MD  carbamazepine (TEGRETOL) 200 MG tablet Take 400 mg by mouth 2 (two) times daily.   Yes [provider]  dipyridamole-aspirin (AGGRENOX) 200-25 MG 12hr capsule Take 1 capsule by mouth 2 (two) times daily.   Yes [provider]  fluticasone (FLONASE) 50 MCG/ACT nasal spray Place into both nostrils daily.   Yes [provider]  lacosamide 100 MG TABS Take 1 tablet (100 mg total) by mouth 2 (two) times daily. 03/13/17  Yes Sudini, Alveta Heimlich, MD  losartan-hydrochlorothiazide (HYZAAR) 100-12.5 MG tablet Take 0.5 tablets by mouth daily.    Yes [provider]  magnesium hydroxide (MILK OF MAGNESIA) 400 MG/5ML suspension Take 30 mLs by mouth daily as needed for moderate constipation.   Yes [provider]  meclizine (ANTIVERT) 32 MG tablet Take 1 tablet (32 mg total) by mouth 3 (three) times daily as needed. 03/06/17  Yes Darel Hong, MD  ondansetron (ZOFRAN ODT) 4 MG disintegrating tablet Take 1 tablet (4 mg total) by mouth every 8 (eight) hours as needed for nausea or vomiting. 03/06/17  Yes Darel Hong, MD  simvastatin (ZOCOR) 40 MG tablet Take 40 mg by mouth at bedtime.   Yes [provider]  vitamin B-12 (CYANOCOBALAMIN) 1000 MCG tablet Take 1,000 mcg by mouth daily.   Yes [provider]  No Known Allergies  Family History  Problem Relation Age of Onset  . CAD Mother   . Stroke Mother   . CAD Father   . Stroke Father     Social History Social History  Substance Use Topics  . Smoking status: Current Every Day Smoker    Packs/day: 2.00    Years: 50.00    Types: Cigarettes  . Smokeless tobacco: Never Used     Comment: In care facility as of 12/11/15 - smokes only 4-5 cigs/day there  . Alcohol use No     Comment: Used to drink a lot but quit 5 years ago.    Review of Systems Constitutional: Negative for fever. Cardiovascular: Negative for chest  pain. Respiratory: Negative for shortness of breath.Occasional cough Gastrointestinal: Moderate upper abdominal pain. Negative for nausea vomiting or diarrhea Genitourinary: Negative for dysuria. Patient does have incontinence, chronic. Musculoskeletal: Negative for back pain. Neurological: Negative for headache All other ROS negative  ____________________________________________   PHYSICAL EXAM:  VITAL SIGNS: ED Triage Vitals  Enc Vitals Group     BP 04/21/17 1345 (!) 76/51     Pulse Rate 04/21/17 1345 94     Resp 04/21/17 1345 (!) 22     Temp 04/21/17 1345 99.2 F (37.3 C)     Temp Source 04/21/17 1345 Oral     SpO2 04/21/17 1345 99 %     Weight 04/21/17 1346 155 lb (70.3 kg)     Height 04/21/17 1346 6\' 1"  (1.854 m)     Head Circumference --      Peak Flow --      Pain Score 04/21/17 1345 2     Pain Loc --      Pain Edu? --      Excl. in [place]? --     Constitutional: Alert and oriented. Well appearing and in no distress. Eyes: Normal exam ENT   Head: Normocephalic and atraumatic.   Mouth/Throat: Mucous membranes are moist. Cardiovascular: Normal rate, regular rhythm. No murmur Respiratory: Normal respiratory effort without tachypnea nor retractions. Breath sounds are clear  Gastrointestinal: Soft, fairly diffuse moderate abdominal tenderness. No rebound or guarding. No distention. Musculoskeletal: Nontender with normal range of motion in all extremities.  Neurologic:  Normal speech and language. No gross focal neurologic deficits. Skin:  Skin is warm, dry and intact.  Psychiatric: Mood and affect are normal.   ____________________________________________    EKG  EKG reviewed and interpreted by mouth so shows normal sinus rhythm at 93 bpm, narrow QRS, normal axis, Larson normal intervals, nonspecific ST changes. There are no ST elevations. Do not agree with computer interpretation.  ____________________________________________    RADIOLOGY  Chest x-ray  most consistent with COPD. No pneumonia.  CT pending Ultrasound pending ____________________________________________   INITIAL IMPRESSION / ASSESSMENT AND PLAN / ED COURSE  Pertinent labs & imaging results that were available during my care of the patient were reviewed by me and considered in my medical decision making (see chart for details).  Patient presents to the department for abdominal pain mostly in the upper abdomen. Upon arrival the patient is noted to be quite hypotensive with a systolic in the 66Y. Patient does have dementia but is currently oriented 4, able to answer questions which appear to be accurate answers. Patient doesn't moderate fairly diffuse abdominal tenderness. No rebound or guarding, no distention. Patient has low-grade fever 99.2 in the emergency department, given his hypotension I have ordered sepsis protocols on the patient we will also  start broad-spectrum antibiotics. We'll obtain a CT scan of the abdomen/pelvis to further evaluate.  Patient's labs show elevated liver function tests as well as a white blood cell count of 16,000. Patient also has a slightly elevated troponin 0.06. Given the elevated LFTs we will perform a CT scan as well as an ultrasound. Patient's tenderness is fairly diffuse, not able to localize to a specific quadrant.  Patient care signed out to oncoming physician.   ____________________________________________   FINAL CLINICAL IMPRESSION(S) / ED DIAGNOSES  Sepsis Abdominal pain    Harvest Dark, MD 04/21/17 1443    Harvest Dark, MD 04/21/17 1454

## 2017-04-21 NOTE — ED Notes (Signed)
Pt unable to tolerate oral contrast. Orders for patient to have CT without drinking oral contrast from Dr. Archie Balboa. Pt to CT.

## 2017-04-21 NOTE — ED Triage Notes (Signed)
Pt arrives to ER via [facility] from the [place] c/o LUQ abdominal pain that began last night. Denies NVD. Pt alert and oriented X4, active, cooperative, pt in NAD. RR even and unlabored, color WNL.

## 2017-04-21 NOTE — ED Notes (Signed)
Pt taken to ultrasound

## 2017-04-21 NOTE — ED Notes (Signed)
Pt given sandwich tray 

## 2017-04-21 NOTE — ED Notes (Addendum)
Daughter of patient, [name], [phone]. She is legal guardian. Pt gives permission to speak with her.

## 2017-04-21 NOTE — ED Notes (Signed)
Pt unable to provide urine sample. Bladder scan revealed 166ml in bladder.

## 2017-04-21 NOTE — ED Notes (Signed)
Pt transport to [facility]

## 2017-04-22 LAB — TROPONIN I
TROPONIN I: 0.05 ng/mL — AB (ref <0.03)
Troponin I: 0.03 ng/mL (ref <0.03)

## 2017-04-22 LAB — BLOOD CULTURE ID PANEL (REFLEXED)
ACINETOBACTER BAUMANNII: NOT DETECTED
CANDIDA ALBICANS: NOT DETECTED
CANDIDA GLABRATA: NOT DETECTED
Candida krusei: NOT DETECTED
Candida parapsilosis: NOT DETECTED
Candida tropicalis: NOT DETECTED
Carbapenem resistance: NOT DETECTED
ENTEROBACTER CLOACAE COMPLEX: NOT DETECTED
ENTEROBACTERIACEAE SPECIES: DETECTED — AB
ENTEROCOCCUS SPECIES: NOT DETECTED
ESCHERICHIA COLI: DETECTED — AB
Haemophilus influenzae: NOT DETECTED
Klebsiella oxytoca: DETECTED — AB
Klebsiella pneumoniae: NOT DETECTED
LISTERIA MONOCYTOGENES: NOT DETECTED
NEISSERIA MENINGITIDIS: NOT DETECTED
PSEUDOMONAS AERUGINOSA: NOT DETECTED
Proteus species: NOT DETECTED
STREPTOCOCCUS AGALACTIAE: NOT DETECTED
STREPTOCOCCUS PNEUMONIAE: NOT DETECTED
STREPTOCOCCUS SPECIES: NOT DETECTED
Serratia marcescens: NOT DETECTED
Staphylococcus aureus (BCID): NOT DETECTED
Staphylococcus species: NOT DETECTED
Streptococcus pyogenes: NOT DETECTED

## 2017-04-22 LAB — CBC
HCT: 30.5 % — ABNORMAL LOW (ref 40.0–52.0)
Hemoglobin: 10.5 g/dL — ABNORMAL LOW (ref 13.0–18.0)
MCH: 31.4 pg (ref 26.0–34.0)
MCHC: 34.3 g/dL (ref 32.0–36.0)
MCV: 91.6 fL (ref 80.0–100.0)
PLATELETS: 201 10*3/uL (ref 150–440)
RBC: 3.32 MIL/uL — AB (ref 4.40–5.90)
RDW: 15.4 % — ABNORMAL HIGH (ref 11.5–14.5)
WBC: 18.6 10*3/uL — ABNORMAL HIGH (ref 3.8–10.6)

## 2017-04-22 LAB — BASIC METABOLIC PANEL
ANION GAP: 6 (ref 5–15)
BUN: 14 mg/dL (ref 6–20)
CO2: 24 mmol/L (ref 22–32)
Calcium: 7.8 mg/dL — ABNORMAL LOW (ref 8.9–10.3)
Chloride: 105 mmol/L (ref 101–111)
Creatinine, Ser: 0.61 mg/dL (ref 0.61–1.24)
GFR calc Af Amer: >60 mL/min (ref >60)
GLUCOSE: 105 mg/dL — AB (ref 65–99)
Potassium: 3.3 mmol/L — ABNORMAL LOW (ref 3.5–5.1)
Sodium: 135 mmol/L (ref 135–145)

## 2017-04-22 MED ORDER — IPRATROPIUM-ALBUTEROL 0.5-2.5 (3) MG/3ML IN SOLN: 3.0000 mL | RESPIRATORY_TRACT | Status: DC | PRN

## 2017-04-22 MED ORDER — SODIUM CHLORIDE 0.9 % IV SOLN
1.0000 g | Freq: Three times a day (TID) | INTRAVENOUS | Status: DC
  Administered 2017-04-22 – 2017-04-24 (×8): 1 g via INTRAVENOUS
  Filled 2017-04-22 (×11): qty 1

## 2017-04-22 NOTE — Progress Notes (Signed)
[facility] [place] NAME: Elijah Evans    MR#:  [id number]  DATE OF BIRTH:  May 15, 1946  SUBJECTIVE:   Patient here due to abdominal pain and noted to be hypotensive and suspected to have colitis and sepsis. A very poor historian presently. Denies any abdominal pain nausea or vomiting. Tolerating a liquid diet well. No diarrhea overnight.  REVIEW OF SYSTEMS:    Review of Systems  Constitutional: Negative for chills and fever.  HENT: Negative for congestion and tinnitus.   Eyes: Negative for blurred vision and double vision.  Respiratory: Negative for cough, shortness of breath and wheezing.   Cardiovascular: Negative for chest pain, orthopnea and PND.  Gastrointestinal: Negative for abdominal pain, diarrhea, nausea and vomiting.  Genitourinary: Negative for dysuria and hematuria.  Neurological: Negative for dizziness, sensory change and focal weakness.  All other systems reviewed and are negative.   Nutrition: Clear liquid Tolerating Diet: Yes Tolerating PT: Wheelchair bound  DRUG ALLERGIES:  No Known Allergies  VITALS:  Blood pressure (!) 147/64, pulse 89, temperature 99 F (37.2 C), temperature source Oral, resp. rate 20, height 6\' 1"  (1.854 m), weight 74.4 kg (164 lb), SpO2 96 %.  PHYSICAL EXAMINATION:   Physical Exam  GENERAL:  71 y.o.-year-old patient lying in bed in no acute distress.  EYES: Pupils equal, round, reactive to light and accommodation. No scleral icterus. Extraocular muscles intact.  HEENT: Head atraumatic, normocephalic. Oropharynx and nasopharynx clear.  NECK:  Supple, no jugular venous distention. No thyroid enlargement, no tenderness.  LUNGS: Normal breath sounds bilaterally, no wheezing, rales, rhonchi. No use of accessory muscles of respiration.  CARDIOVASCULAR: S1, S2 normal. No murmurs, rubs, or gallops.  ABDOMEN: Soft, nontender, nondistended. Bowel sounds present. No organomegaly or mass.   EXTREMITIES: No cyanosis, clubbing or edema b/l.    NEUROLOGIC: Cranial nerves II through XII are intact. No focal Motor or sensory deficits b/l. Globally weak PSYCHIATRIC: The patient is alert and oriented x 3.  SKIN: No obvious rash, lesion, or ulcer.    LABORATORY PANEL:   CBC  Recent Labs Lab 04/22/17 0400  WBC 18.6*  HGB 10.5*  HCT 30.5*  PLT 201   ------------------------------------------------------------------------------------------------------------------  Chemistries   Recent Labs Lab 04/21/17 1344 04/22/17 0400  NA 131* 135  K 3.6 3.3*  CL 98* 105  CO2 24 24  GLUCOSE 118* 105*  BUN 15 14  CREATININE 0.87 0.61  CALCIUM 8.5* 7.8*  AST 203*  --   ALT 127*  --   ALKPHOS 263*  --   BILITOT 3.2*  --    ------------------------------------------------------------------------------------------------------------------  Cardiac Enzymes  Recent Labs Lab 04/22/17 0950  TROPONINI 0.03*   ------------------------------------------------------------------------------------------------------------------  RADIOLOGY:  Ct Abdomen Pelvis W Contrast  Result Date: 04/21/2017 CLINICAL DATA:  Diffuse abdominal pain EXAM: CT ABDOMEN AND PELVIS WITH CONTRAST TECHNIQUE: Multidetector CT imaging of the abdomen and pelvis was performed using the standard protocol following bolus administration of intravenous contrast. CONTRAST:  129mL ISOVUE-300 IOPAMIDOL (ISOVUE-300) INJECTION 61% COMPARISON:  12/08/2015 FINDINGS: Lower chest:  No acute finding Hepatobiliary: 1 cm hypervascular focus in the upper right liver is stable from 2017, likely a flash fill hemangioma or small shunt.No evidence of biliary obstruction or stone. Pancreas: Unremarkable. Spleen: Unremarkable. Adrenals/Urinary Tract: Thickened left adrenal glands without discrete mass, stable. No hydronephrosis or stone. Simple appearing bilateral renal cysts. Unremarkable bladder. Stomach/Bowel: No obstruction or  convincing inflammation. Chronic soft tissue contiguous with ileal mesenteric fat, where there  is also prominent nodes and distortion of mesenteric vessels. The associated fat appears hypertrophic with greater small bowel spacing. This appearance is nonprogressive and presumably postinflammatory. High-density material within the ileum and proximal colon is presumably ingested; there is no history of GI bleeding. Vascular/Lymphatic: Confluent atherosclerotic plaque. No retroperitoneal adenopathy or mass. Reproductive:No pathologic findings. Other: No ascites or pneumoperitoneum.  Fatty left inguinal hernia. Musculoskeletal: Stable generalized bony demineralization and heterogeneity. No acute or focal aggressive finding. IMPRESSION: 1. No acute finding when compared to January 2017. 2. 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 Is there history of prior inflammatory bowel disease? 3.  Aortic Atherosclerosis (ICD10-I70.0), extensive. 4. Fatty left inguinal hernia. 5. Stable bony demineralization and heterogeneity. Electronically Signed   By: Monte Fantasia M.D.   On: 04/21/2017 17:17   Dg Chest Port 1 View  Result Date: 04/21/2017 CLINICAL DATA:  Cough and sepsis EXAM: PORTABLE CHEST 1 VIEW COMPARISON:  03/12/2017 FINDINGS: Borderline cardiomegaly that is stable. Stable mediastinal contours. Chronic prominent markings in the right infrahilar lung. There is generalized interstitial coarsening from COPD. Calcified granuloma in the right lung. No effusion, air bronchogram, or pneumothorax. IMPRESSION: COPD without acute superimposed finding. Electronically Signed   By: Monte Fantasia M.D.   On: 04/21/2017 14:18   US Abdomen Limited Ruq  Result Date: 04/21/2017 CLINICAL DATA:  Abdominal pain, elevated liver function tests. EXAM: ULTRASOUND ABDOMEN LIMITED RIGHT UPPER QUADRANT COMPARISON:  None. FINDINGS: Gallbladder: The gallbladder is not visualized. Common bile duct: Diameter: 6.2  mm. Liver: No focal lesion identified. Within normal limits in parenchymal echogenicity. Trace amount of free fluid adjacent to the liver. Incidental finding of superior right renal cyst with benign appearance measuring 1.9 x 2.0 x 2.3 cm. IMPRESSION: The gallbladder is not visualized, which may be due to prior cholecystectomy or completely collapsed gallbladder. No evidence of dilation of intra or extrahepatic bile ducts. Trace amount of perihepatic ascites. Electronically Signed   By: Fidela Salisbury M.D.   On: 04/21/2017 16:44     ASSESSMENT AND PLAN:   71 year old male previous history of TIA/CVA, history of seizures, hypertension, dementia, osteoarthritis of presented to the hospital due to abdominal pain and hypotension.  1. Sepsis-this was the working diagnosis given the patient's hypotension, leukocytosis and abdominal pain. -CT abdomen pelvis although was negative for any acute pathology. The source of sepsis syndrome remains unclear presently. -Continue IV meropenem empirically for now.  2. Abdominal pain-etiology unclear. CT scan of the abdomen pelvis/abdominal ultrasound negative for acute pathology. Abdominal pain has now resolved. -Patient is tolerating a clear liquid diet, advance to a regular diet and follow. If continues to have persistent pain would consider gastroenterology consult.  3. History of seizures-no acute seizures presently. Continue Vimpat, Tegretol.  4. Hyperlipidemia-continue simvastatin.  5. History of previous CVA-continue simvastatin, Aggrenox.   All the records are reviewed and case discussed with Care Management/Social Worker. Management plans discussed with the patient, family and they are in agreement.  CODE STATUS: Full code  DVT Prophylaxis: Lovenox  TOTAL TIME TAKING CARE OF THIS PATIENT: 30 minutes.   POSSIBLE D/C IN 1-2 DAYS, DEPENDING ON CLINICAL CONDITION.   Henreitta Leber M.D on 04/22/2017 at 2:58 PM  Between 7am to 6pm - Pager -  [phone]  After 6pm go to [email or web address] - [employer] [facility]  Office  [phone]  CC: Primary care physician; [facility]

## 2017-04-22 NOTE — Progress Notes (Addendum)
PHARMACY - PHYSICIAN COMMUNICATION CRITICAL VALUE ALERT - BLOOD CULTURE IDENTIFICATION (BCID)   [facility] hospital lab called to update results - Gram + cocci found in 4 of 4 culture samples - call rec'd 1040 hours on 17 June 18.   Results for orders placed or performed during the hospital encounter of 04/21/17  Blood Culture ID Panel (Reflexed) (Collected: 04/21/2017  1:48 PM)  Result Value Ref Range   Enterococcus species NOT DETECTED NOT DETECTED   Listeria monocytogenes NOT DETECTED NOT DETECTED   Staphylococcus species NOT DETECTED NOT DETECTED   Staphylococcus aureus NOT DETECTED NOT DETECTED   Streptococcus species NOT DETECTED NOT DETECTED   Streptococcus agalactiae NOT DETECTED NOT DETECTED   Streptococcus pneumoniae NOT DETECTED NOT DETECTED   Streptococcus pyogenes NOT DETECTED NOT DETECTED   Acinetobacter baumannii NOT DETECTED NOT DETECTED   Enterobacteriaceae species DETECTED (A) NOT DETECTED   Enterobacter cloacae complex NOT DETECTED NOT DETECTED   Escherichia coli DETECTED (A) NOT DETECTED   Klebsiella oxytoca DETECTED (A) NOT DETECTED   Klebsiella pneumoniae NOT DETECTED NOT DETECTED   Proteus species NOT DETECTED NOT DETECTED   Serratia marcescens NOT DETECTED NOT DETECTED   Carbapenem resistance NOT DETECTED NOT DETECTED   Haemophilus influenzae NOT DETECTED NOT DETECTED   Neisseria meningitidis NOT DETECTED NOT DETECTED   Pseudomonas aeruginosa NOT DETECTED NOT DETECTED   Candida albicans NOT DETECTED NOT DETECTED   Candida glabrata NOT DETECTED NOT DETECTED   Candida krusei NOT DETECTED NOT DETECTED   Candida parapsilosis NOT DETECTED NOT DETECTED   Candida tropicalis NOT DETECTED NOT DETECTED    Name of physician (or Provider) Contacted: [facility]  Changes to prescribed antibiotics required: Started on meropenem.  McBane,Matthew S 04/22/2017  5:45 AM

## 2017-04-22 NOTE — Plan of Care (Signed)
Problem: Safety: Goal: Ability to remain free from injury will improve Pt confused

## 2017-04-23 LAB — CBC
HEMATOCRIT: 30 % — AB (ref 40.0–52.0)
HEMOGLOBIN: 10.3 g/dL — AB (ref 13.0–18.0)
MCH: 31.9 pg (ref 26.0–34.0)
MCHC: 34.4 g/dL (ref 32.0–36.0)
MCV: 92.6 fL (ref 80.0–100.0)
Platelets: 161 10*3/uL (ref 150–440)
RBC: 3.24 MIL/uL — ABNORMAL LOW (ref 4.40–5.90)
RDW: 15.4 % — ABNORMAL HIGH (ref 11.5–14.5)
WBC: 10.4 10*3/uL (ref 3.8–10.6)

## 2017-04-23 LAB — URINE CULTURE: Culture: NO GROWTH

## 2017-04-23 LAB — MAGNESIUM: Magnesium: 1.8 mg/dL (ref 1.7–2.4)

## 2017-04-23 LAB — VANCOMYCIN, TROUGH

## 2017-04-23 LAB — POTASSIUM: Potassium: 3.2 mmol/L — ABNORMAL LOW (ref 3.5–5.1)

## 2017-04-23 NOTE — Progress Notes (Addendum)
[facility] at [place] NAME: Elijah Evans    MR#:  [id number]  DATE OF BIRTH:  05-Jan-1946  SUBJECTIVE:   Patient here due to abdominal pain and noted to be hypotensive and suspected to have colitis and sepsis. A very poor historian presently. Denies any abdominal pain nausea or vomiting. Tolerating a liquid diet well. No diarrhea overnight.  REVIEW OF SYSTEMS:    Review of Systems  Constitutional: Negative for chills and fever.  HENT: Negative for congestion and tinnitus.   Eyes: Negative for blurred vision and double vision.  Respiratory: Negative for cough, shortness of breath and wheezing.   Cardiovascular: Negative for chest pain, orthopnea and PND.  Gastrointestinal: Negative for abdominal pain, diarrhea, nausea and vomiting.  Genitourinary: Negative for dysuria and hematuria.  Neurological: Negative for dizziness, sensory change and focal weakness.  All other systems reviewed and are negative.   Nutrition: Clear liquid Tolerating Diet: Yes Tolerating PT: Wheelchair bound  DRUG ALLERGIES:  No Known Allergies  VITALS:  Blood pressure (!) 165/74, pulse 83, temperature 98 F (36.7 C), resp. rate 16, height 6\' 1"  (1.854 m), weight 74.4 kg (164 lb), SpO2 99 %.  PHYSICAL EXAMINATION:   Physical Exam  GENERAL:  71 y.o.-year-old patient lying in bed in no acute distress.  EYES: Pupils equal, round, reactive to light and accommodation. No scleral icterus. Extraocular muscles intact.  HEENT: Head atraumatic, normocephalic. Oropharynx and nasopharynx clear.  NECK:  Supple, no jugular venous distention. No thyroid enlargement, no tenderness.  LUNGS: Normal breath sounds bilaterally, no wheezing, rales, rhonchi. No use of accessory muscles of respiration.  CARDIOVASCULAR: S1, S2 normal. No murmurs, rubs, or gallops.  ABDOMEN: Soft, nontender, nondistended. Bowel sounds present. No organomegaly or mass.  EXTREMITIES: No cyanosis, clubbing  or edema b/l.    NEUROLOGIC: Cranial nerves II through XII are intact. No focal Motor or sensory deficits b/l. Globally weak PSYCHIATRIC: The patient is alert and oriented x 3.  SKIN: No obvious rash, lesion, or ulcer.    LABORATORY PANEL:   CBC  Recent Labs Lab 04/23/17 0450  WBC 10.4  HGB 10.3*  HCT 30.0*  PLT 161   ------------------------------------------------------------------------------------------------------------------  Chemistries   Recent Labs Lab 04/21/17 1344 04/22/17 0400 04/23/17 0450  NA 131* 135  --   K 3.6 3.3* 3.2*  CL 98* 105  --   CO2 24 24  --   GLUCOSE 118* 105*  --   BUN 15 14  --   CREATININE 0.87 0.61  --   CALCIUM 8.5* 7.8*  --   MG  --   --  1.8  AST 203*  --   --   ALT 127*  --   --   ALKPHOS 263*  --   --   BILITOT 3.2*  --   --    ------------------------------------------------------------------------------------------------------------------  Cardiac Enzymes  Recent Labs Lab 04/22/17 0950  TROPONINI 0.03*   ------------------------------------------------------------------------------------------------------------------  RADIOLOGY:  Ct Abdomen Pelvis W Contrast  Result Date: 04/21/2017 CLINICAL DATA:  Diffuse abdominal pain EXAM: CT ABDOMEN AND PELVIS WITH CONTRAST TECHNIQUE: Multidetector CT imaging of the abdomen and pelvis was performed using the standard protocol following bolus administration of intravenous contrast. CONTRAST:  129mL ISOVUE-300 IOPAMIDOL (ISOVUE-300) INJECTION 61% COMPARISON:  12/08/2015 FINDINGS: Lower chest:  No acute finding Hepatobiliary: 1 cm hypervascular focus in the upper right liver is stable from 2017, likely a flash fill hemangioma or small shunt.No evidence of biliary obstruction or stone.  Pancreas: Unremarkable. Spleen: Unremarkable. Adrenals/Urinary Tract: Thickened left adrenal glands without discrete mass, stable. No hydronephrosis or stone. Simple appearing bilateral renal cysts.  Unremarkable bladder. Stomach/Bowel: No obstruction or convincing inflammation. Chronic soft tissue contiguous with ileal mesenteric fat, where there is also prominent nodes and distortion of mesenteric vessels. The associated fat appears hypertrophic with greater small bowel spacing. This appearance is nonprogressive and presumably postinflammatory. High-density material within the ileum and proximal colon is presumably ingested; there is no history of GI bleeding. Vascular/Lymphatic: Confluent atherosclerotic plaque. No retroperitoneal adenopathy or mass. Reproductive:No pathologic findings. Other: No ascites or pneumoperitoneum.  Fatty left inguinal hernia. Musculoskeletal: Stable generalized bony demineralization and heterogeneity. No acute or focal aggressive finding. IMPRESSION: 1. No acute finding when compared to January 2017. 2. 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 Is there history of prior inflammatory bowel disease? 3.  Aortic Atherosclerosis (ICD10-I70.0), extensive. 4. Fatty left inguinal hernia. 5. Stable bony demineralization and heterogeneity. Electronically Signed   By: Monte Fantasia M.D.   On: 04/21/2017 17:17   Dg Chest Port 1 View  Result Date: 04/21/2017 CLINICAL DATA:  Cough and sepsis EXAM: PORTABLE CHEST 1 VIEW COMPARISON:  03/12/2017 FINDINGS: Borderline cardiomegaly that is stable. Stable mediastinal contours. Chronic prominent markings in the right infrahilar lung. There is generalized interstitial coarsening from COPD. Calcified granuloma in the right lung. No effusion, air bronchogram, or pneumothorax. IMPRESSION: COPD without acute superimposed finding. Electronically Signed   By: Monte Fantasia M.D.   On: 04/21/2017 14:18   US Abdomen Limited Ruq  Result Date: 04/21/2017 CLINICAL DATA:  Abdominal pain, elevated liver function tests. EXAM: ULTRASOUND ABDOMEN LIMITED RIGHT UPPER QUADRANT COMPARISON:  None. FINDINGS: Gallbladder: The  gallbladder is not visualized. Common bile duct: Diameter: 6.2 mm. Liver: No focal lesion identified. Within normal limits in parenchymal echogenicity. Trace amount of free fluid adjacent to the liver. Incidental finding of superior right renal cyst with benign appearance measuring 1.9 x 2.0 x 2.3 cm. IMPRESSION: The gallbladder is not visualized, which may be due to prior cholecystectomy or completely collapsed gallbladder. No evidence of dilation of intra or extrahepatic bile ducts. Trace amount of perihepatic ascites. Electronically Signed   By: Fidela Salisbury M.D.   On: 04/21/2017 16:44     ASSESSMENT AND PLAN:   71 year old male previous history of TIA/CVA, history of seizures, hypertension, dementia, osteoarthritis of presented to the hospital due to abdominal pain and hypotension.  1. Sepsis-this was the working diagnosis given the patient's hypotension, leukocytosis and abdominal pain. -CT abdomen pelvis although was negative for any acute pathology. The source of sepsis  remains unclear presently. -Blood cultures are positive for Enterobacter, Klebsiella but source unclear. UA (-) and no acute GI symptoms.  - cont. IV Meropenem for now and will get ID consult in a.m. Tomorrow.   2. Abdominal pain-etiology unclear. CT scan of the abdomen pelvis/abdominal ultrasound negative for acute pathology. Abdominal pain has now resolved. -Patient is tolerating a regular diet well.   3. Bacteremia - pt. Has E. Coli in his blood but source unclear presently.  - cont. IV Meropenem for now.  Will get ID consult in a.m.   4. History of seizures-no acute seizures presently. Continue Vimpat, Tegretol.  5. Hyperlipidemia-continue simvastatin.  6. History of previous CVA-continue simvastatin, Aggrenox.   All the records are reviewed and case discussed with Care Management/Social Worker. Management plans discussed with the patient, family and they are in agreement.  CODE STATUS: Full code  DVT  Prophylaxis: Lovenox  TOTAL TIME TAKING CARE OF THIS PATIENT: 25 minutes.   POSSIBLE D/C IN 1-2 DAYS, DEPENDING ON CLINICAL CONDITION.   Henreitta Leber M.D on 04/23/2017 at 1:01 PM  Between 7am to 6pm - Pager - [phone]  After 6pm go to [email or web address] - [employer] [facility]  Office  [phone]  CC: Primary care physician; [facility]

## 2017-04-24 LAB — CBC
HCT: 30 % — ABNORMAL LOW (ref 40.0–52.0)
Hemoglobin: 10.2 g/dL — ABNORMAL LOW (ref 13.0–18.0)
MCH: 31.4 pg (ref 26.0–34.0)
MCHC: 34.1 g/dL (ref 32.0–36.0)
MCV: 92.3 fL (ref 80.0–100.0)
PLATELETS: 143 10*3/uL — AB (ref 150–440)
RBC: 3.25 MIL/uL — ABNORMAL LOW (ref 4.40–5.90)
RDW: 15.2 % — AB (ref 11.5–14.5)
WBC: 7.6 10*3/uL (ref 3.8–10.6)

## 2017-04-24 MED ORDER — PIPERACILLIN-TAZOBACTAM 3.375 G IVPB
3.3750 g | Freq: Three times a day (TID) | INTRAVENOUS | Status: DC
  Administered 2017-04-24 – 2017-04-25 (×3): 3.375 g via INTRAVENOUS
  Filled 2017-04-24 (×7): qty 50

## 2017-04-24 NOTE — Consult Note (Signed)
[facility]     Reason for Consult: Polymicrobial bacteremia    Referring Physician: Jeronimo Greaves Date of Admission:  04/21/2017   Principal Problem:   Sepsis ([place]) Active Problems:   Abdominal pain   Seizures ([place])   HTN (hypertension)   HPI: Elijah Evans is a 71 y.o. male admitted with RUQ abd pain and found to have wbc 16, no fevers, LFTs elevated, CT abd with some chronic changes around ileum but no acute findings. He has polymicrobial bacteremia with E coli, Klebsiell and enterococcus.   Past Medical History:  Diagnosis Date  . Arthritis    osteo - pelvic area  . Dementia    early stages  . Hypertension   . Left leg weakness    S/P TIA  . Motion sickness    cars  . Seizures ([place])   . Shortness of breath dyspnea   . Stroke ([place])   . TIA (transient ischemic attack)    X3 - last one approx 2012  . Wears dentures    full upper and lower   Past Surgical History:  Procedure Laterality Date  . FOOT SURGERY    . TONSILLECTOMY     Social History  Substance Use Topics  . Smoking status: Current Every Day Smoker    Packs/day: 2.00    Years: 50.00    Types: Cigarettes  . Smokeless tobacco: Never Used     Comment: In care facility as of 12/11/15 - smokes only 4-5 cigs/day there  . Alcohol use No     Comment: Used to drink a lot but quit 5 years ago.   Family History  Problem Relation Age of Onset  . CAD Mother   . Stroke Mother   . CAD Father   . Stroke Father     Allergies: No Known Allergies  Current antibiotics: Antibiotics Given (last 72 hours)    Date/Time Action Medication Dose Rate   04/22/17 0350 New Bag/Given   meropenem (MERREM) 1 g in sodium chloride 0.9 % 100 mL IVPB 1 g 200 mL/hr   04/22/17 1021 New Bag/Given   meropenem (MERREM) 1 g in sodium chloride 0.9 % 100 mL IVPB 1 g 200 mL/hr   04/22/17 1714 New Bag/Given   meropenem (MERREM) 1 g in sodium chloride 0.9 % 100 mL IVPB 1 g 200 mL/hr   04/23/17 0258 New Bag/Given    meropenem (MERREM) 1 g in sodium chloride 0.9 % 100 mL IVPB 1 g 200 mL/hr   04/23/17 1022 New Bag/Given   meropenem (MERREM) 1 g in sodium chloride 0.9 % 100 mL IVPB 1 g 200 mL/hr   04/23/17 1813 New Bag/Given   meropenem (MERREM) 1 g in sodium chloride 0.9 % 100 mL IVPB 1 g 200 mL/hr   04/24/17 0226 New Bag/Given   meropenem (MERREM) 1 g in sodium chloride 0.9 % 100 mL IVPB 1 g 200 mL/hr   04/24/17 1010 New Bag/Given   meropenem (MERREM) 1 g in sodium chloride 0.9 % 100 mL IVPB 1 g 200 mL/hr      MEDICATIONS: . carbamazepine  400 mg Oral BID  . dipyridamole-aspirin  1 capsule Oral BID  . enoxaparin (LOVENOX) injection  40 mg Subcutaneous Q24H  . lacosamide  100 mg Oral BID  . simvastatin  40 mg Oral QHS    Review of Systems - unable to obtain  OBJECTIVE: Temp:  [98.2 F (36.8 C)-98.9 F (37.2 C)] 98.9 F (37.2 C) (06/18 1349)  Pulse Rate:  [73-82] 81 (06/18 1349) Resp:  [16-18] 16 (06/18 1349) BP: (159-189)/(76-83) 159/76 (06/18 1349) SpO2:  [97 %-98 %] 98 % (06/18 1349) Physical Exam  Constitutional: He is disheveled, very HOH, confused HENT: mild icterus Mouth/Throat: Oropharynx is clear and dry. No oropharyngeal exudate.  Cardiovascular: Normal rate, regular rhythm and normal heart sounds. Pulmonary/Chest: Effort normal and breath sounds normal. No respiratory distress. He has no wheezes.  Abdominal: Soft. Obese, mild ttp RLQ and RUQ, Lower> upper  Bowel sounds are normal. He exhibits no distension. Lymphadenopathy:  He has no cervical adenopathy.  Neurological: He is alert - somewhat confused, HOH Skin: Skin is warm and dry. No rash noted. No erythema.  Psychiatric: He has a normal mood and affect. His behavior is normal.     LABS: Results for orders placed or performed during the hospital encounter of 04/21/17 (from the past 48 hour(s))  Vancomycin, trough     Status: Abnormal   Collection Time: 04/23/17  4:50 AM  Result Value Ref Range   Vancomycin Tr <4  (L) 15 - 20 ug/mL  CBC     Status: Abnormal   Collection Time: 04/23/17  4:50 AM  Result Value Ref Range   WBC 10.4 3.8 - 10.6 K/uL   RBC 3.24 (L) 4.40 - 5.90 MIL/uL   Hemoglobin 10.3 (L) 13.0 - 18.0 g/dL   HCT 30.0 (L) 40.0 - 52.0 %   MCV 92.6 80.0 - 100.0 fL   MCH 31.9 26.0 - 34.0 pg   MCHC 34.4 32.0 - 36.0 g/dL   RDW 15.4 (H) 11.5 - 14.5 %   Platelets 161 150 - 440 K/uL  Potassium     Status: Abnormal   Collection Time: 04/23/17  4:50 AM  Result Value Ref Range   Potassium 3.2 (L) 3.5 - 5.1 mmol/L  Magnesium     Status: None   Collection Time: 04/23/17  4:50 AM  Result Value Ref Range   Magnesium 1.8 1.7 - 2.4 mg/dL  CBC     Status: Abnormal   Collection Time: 04/24/17  4:04 AM  Result Value Ref Range   WBC 7.6 3.8 - 10.6 K/uL   RBC 3.25 (L) 4.40 - 5.90 MIL/uL   Hemoglobin 10.2 (L) 13.0 - 18.0 g/dL   HCT 30.0 (L) 40.0 - 52.0 %   MCV 92.3 80.0 - 100.0 fL   MCH 31.4 26.0 - 34.0 pg   MCHC 34.1 32.0 - 36.0 g/dL   RDW 15.2 (H) 11.5 - 14.5 %   Platelets 143 (L) 150 - 440 K/uL   No components found for: ESR, C REACTIVE PROTEIN MICRO: Recent Results (from the past 720 hour(s))  Blood Culture (routine x 2)     Status: Abnormal (Preliminary result)   Collection Time: 04/21/17  1:48 PM  Result Value Ref Range Status   Specimen Description LEFT ANTECUBITAL  Final   Special Requests   Final    BOTTLES DRAWN AEROBIC AND ANAEROBIC Blood Culture results may not be optimal due to an excessive volume of blood received in culture bottles   Culture  Setup Time   Final    GRAM NEGATIVE RODS IN BOTH AEROBIC AND ANAEROBIC BOTTLES CRITICAL RESULT CALLED TO, READ BACK BY AND VERIFIED WITH: MATT MCBANE AT 0120 ON 04/22/17 RWW CONFIRMED BY [facility]    Culture (A)  Final    ESCHERICHIA COLI KLEBSIELLA OXYTOCA GRAM POSITIVE COCCI CULTURE REINCUBATED FOR BETTER GROWTH SUSCEPTIBILITIES PERFORMED ON PREVIOUS CULTURE WITHIN THE LAST  5 DAYS. FOR E. COLI AND KLEB. OXYTOCA CRITICAL RESULT CALLED TO,  READ BACK BY AND VERIFIED WITH: H ZOMPA,PHARMD AT 1027 04/23/17 BY L BENFIELD CONCERNING GROWTH ON CULTURE Performed at [facility], [place] [address] [place], [place] [address]    Report Status PENDING  Incomplete  Blood Culture ID Panel (Reflexed)     Status: Abnormal   Collection Time: 04/21/17  1:48 PM  Result Value Ref Range Status   Enterococcus species NOT DETECTED NOT DETECTED Final   Listeria monocytogenes NOT DETECTED NOT DETECTED Final   Staphylococcus species NOT DETECTED NOT DETECTED Final   Staphylococcus aureus NOT DETECTED NOT DETECTED Final   Streptococcus species NOT DETECTED NOT DETECTED Final   Streptococcus agalactiae NOT DETECTED NOT DETECTED Final   Streptococcus pneumoniae NOT DETECTED NOT DETECTED Final   Streptococcus pyogenes NOT DETECTED NOT DETECTED Final   Acinetobacter baumannii NOT DETECTED NOT DETECTED Final   Enterobacteriaceae species DETECTED (A) NOT DETECTED Final    Comment: CRITICAL RESULT CALLED TO, READ BACK BY AND VERIFIED WITH: MATT MCBANE AT 0120 ON 04/22/17 RWW    Enterobacter cloacae complex NOT DETECTED NOT DETECTED Final   Escherichia coli DETECTED (A) NOT DETECTED Final    Comment: CRITICAL RESULT CALLED TO, READ BACK BY AND VERIFIED WITH: MATT MCBANE AT 0120 ON 04/22/17 RWW    Klebsiella oxytoca DETECTED (A) NOT DETECTED Final    Comment: CRITICAL RESULT CALLED TO, READ BACK BY AND VERIFIED WITH: MATT MCBANE AT 0120 ON 04/22/17 RWW    Klebsiella pneumoniae NOT DETECTED NOT DETECTED Final   Proteus species NOT DETECTED NOT DETECTED Final   Serratia marcescens NOT DETECTED NOT DETECTED Final   Carbapenem resistance NOT DETECTED NOT DETECTED Final   Haemophilus influenzae NOT DETECTED NOT DETECTED Final   Neisseria meningitidis NOT DETECTED NOT DETECTED Final   Pseudomonas aeruginosa NOT DETECTED NOT DETECTED Final   Candida albicans NOT DETECTED NOT DETECTED Final   Candida glabrata NOT DETECTED NOT DETECTED Final   Candida krusei  NOT DETECTED NOT DETECTED Final   Candida parapsilosis NOT DETECTED NOT DETECTED Final   Candida tropicalis NOT DETECTED NOT DETECTED Final  Urine culture     Status: None   Collection Time: 04/21/17  2:02 PM  Result Value Ref Range Status   Specimen Description URINE, RANDOM  Final   Special Requests NONE  Final   Culture   Final    NO GROWTH Performed at [facility], [address], [place], [place] [address]    Report Status 04/23/2017 FINAL  Final  Blood Culture (routine x 2)     Status: Abnormal (Preliminary result)   Collection Time: 04/21/17  2:03 PM  Result Value Ref Range Status   Specimen Description BLOOD RIGHT ARM  Final   Special Requests   Final    BOTTLES DRAWN AEROBIC AND ANAEROBIC Blood Culture results may not be optimal due to an excessive volume of blood received in culture bottles   Culture  Setup Time   Final    GRAM NEGATIVE RODS IN BOTH AEROBIC AND ANAEROBIC BOTTLES CRITICAL VALUE NOTED.  VALUE IS CONSISTENT WITH PREVIOUSLY REPORTED AND CALLED VALUE. CONFIRMED BY [facility]    Culture (A)  Final    ESCHERICHIA COLI KLEBSIELLA OXYTOCA ENTEROCOCCUS CASSELIFLAVUS SUSCEPTIBILITIES TO FOLLOW CRITICAL RESULT CALLED TO, READ BACK BY AND VERIFIED WITH: H ZOMPA,PHARMD AT 1028 04/23/17 BY L BENFIELD CONCERNING GROWTH ON CULTURE Performed at [facility], [place] [address]   [place], [place] [address]    Report Status PENDING  Incomplete   Organism ID, Bacteria ESCHERICHIA COLI  Final   Organism ID, Bacteria KLEBSIELLA OXYTOCA  Final      Susceptibility   Escherichia coli - MIC*    AMPICILLIN >=32 RESISTANT Resistant     CEFAZOLIN <=4 SENSITIVE Sensitive     CEFEPIME <=1 SENSITIVE Sensitive     CEFTAZIDIME <=1 SENSITIVE Sensitive     CEFTRIAXONE <=1 SENSITIVE Sensitive     CIPROFLOXACIN <=0.25 SENSITIVE Sensitive     GENTAMICIN <=1 SENSITIVE Sensitive     IMIPENEM <=0.25 SENSITIVE Sensitive     TRIMETH/SULFA >=320 RESISTANT Resistant      AMPICILLIN/SULBACTAM >=32 RESISTANT Resistant     PIP/TAZO <=4 SENSITIVE Sensitive     Extended ESBL NEGATIVE Sensitive     * ESCHERICHIA COLI   Klebsiella oxytoca - MIC*    AMPICILLIN >=32 RESISTANT Resistant     CEFAZOLIN >=64 RESISTANT Resistant     CEFEPIME <=1 SENSITIVE Sensitive     CEFTAZIDIME <=1 SENSITIVE Sensitive     CEFTRIAXONE <=1 SENSITIVE Sensitive     CIPROFLOXACIN <=0.25 SENSITIVE Sensitive     GENTAMICIN <=1 SENSITIVE Sensitive     IMIPENEM <=0.25 SENSITIVE Sensitive     TRIMETH/SULFA <=20 SENSITIVE Sensitive     AMPICILLIN/SULBACTAM 16 INTERMEDIATE Intermediate     PIP/TAZO <=4 SENSITIVE Sensitive     Extended ESBL NEGATIVE Sensitive     * KLEBSIELLA OXYTOCA    IMAGING: Ct Abdomen Pelvis W Contrast  Result Date: 04/21/2017 CLINICAL DATA:  Diffuse abdominal pain EXAM: CT ABDOMEN AND PELVIS WITH CONTRAST TECHNIQUE: Multidetector CT imaging of the abdomen and pelvis was performed using the standard protocol following bolus administration of intravenous contrast. CONTRAST:  168m ISOVUE-300 IOPAMIDOL (ISOVUE-300) INJECTION 61% COMPARISON:  12/08/2015 FINDINGS: Lower chest:  No acute finding Hepatobiliary: 1 cm hypervascular focus in the upper right liver is stable from 2017, likely a flash fill hemangioma or small shunt.No evidence of biliary obstruction or stone. Pancreas: Unremarkable. Spleen: Unremarkable. Adrenals/Urinary Tract: Thickened left adrenal glands without discrete mass, stable. No hydronephrosis or stone. Simple appearing bilateral renal cysts. Unremarkable bladder. Stomach/Bowel: No obstruction or convincing inflammation. Chronic soft tissue contiguous with ileal mesenteric fat, where there is also prominent nodes and distortion of mesenteric vessels. The associated fat appears hypertrophic with greater small bowel spacing. This appearance is nonprogressive and presumably postinflammatory. High-density material within the ileum and proximal colon is presumably  ingested; there is no history of GI bleeding. Vascular/Lymphatic: Confluent atherosclerotic plaque. No retroperitoneal adenopathy or mass. Reproductive:No pathologic findings. Other: No ascites or pneumoperitoneum.  Fatty left inguinal hernia. Musculoskeletal: Stable generalized bony demineralization and heterogeneity. No acute or focal aggressive finding. IMPRESSION: 1. No acute finding when compared to January 2017. 2. 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 Is there history of prior inflammatory bowel disease? 3.  Aortic Atherosclerosis (ICD10-I70.0), extensive. 4. Fatty left inguinal hernia. 5. Stable bony demineralization and heterogeneity. Electronically Signed   By: JMonte FantasiaM.D.   On: 04/21/2017 17:17   Dg Chest Port 1 View  Result Date: 04/21/2017 CLINICAL DATA:  Cough and sepsis EXAM: PORTABLE CHEST 1 VIEW COMPARISON:  03/12/2017 FINDINGS: Borderline cardiomegaly that is stable. Stable mediastinal contours. Chronic prominent markings in the right infrahilar lung. There is generalized interstitial coarsening from COPD. Calcified granuloma in the right lung. No effusion, air bronchogram, or pneumothorax. IMPRESSION: COPD without acute superimposed finding. Electronically Signed   By: JAngelica Chessman  Watts M.D.   On: 04/21/2017 14:18   US Abdomen Limited Ruq  Result Date: 04/21/2017 CLINICAL DATA:  Abdominal pain, elevated liver function tests. EXAM: ULTRASOUND ABDOMEN LIMITED RIGHT UPPER QUADRANT COMPARISON:  None. FINDINGS: Gallbladder: The gallbladder is not visualized. Common bile duct: Diameter: 6.2 mm. Liver: No focal lesion identified. Within normal limits in parenchymal echogenicity. Trace amount of free fluid adjacent to the liver. Incidental finding of superior right renal cyst with benign appearance measuring 1.9 x 2.0 x 2.3 cm. IMPRESSION: The gallbladder is not visualized, which may be due to prior cholecystectomy or completely collapsed gallbladder. No  evidence of dilation of intra or extrahepatic bile ducts. Trace amount of perihepatic ascites. Electronically Signed   By: Fidela Salisbury M.D.   On: 04/21/2017 16:44    Assessment:   Elijah Evans is a 72 y.o. male admitted with L sided abd pain - upper and lower, as well as leukocytosis, elevated LFTs and chronic findings on CT in LLQ. 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 I am not sure however he is a reliable historian.  Recommendations Repeat LFTs in AM DC meropenem Start zosyn to which the E coli and Kleb were sensitive to (enterococcus should be sensitive to this as well but pending) Could probably dc on oral regimen ( cipro would cover both E coli and Kleb and then augmentin could cover the enterococcus and also cover anaerobes. Will need colonoscopy and further evaluation but this could likely be done as otpt - however would discuss the CT findings with GI to see if they would consider inpatient Scope.  Thank you very much for allowing me to participate in the care of this patient. Please call with questions.   Cheral Marker. Ola Spurr, MD

## 2017-04-24 NOTE — Progress Notes (Signed)
Pharmacy Antibiotic Note  Elijah Evans is a 71 y.o. male admitted on 04/21/2017 with polymicrobial bacteremia.  Pharmacy has been consulted for Zosyn dosing. ID consulted: Per ID note: DC meropenem. Start zosyn to which the E coli and Kleb were sensitive to (enterococcus should be sensitive to this as well but pending)  Plan: Zosyn 3.375g IV q8h (4 hour infusion).  Height: 6\' 1"  (185.4 cm) Weight: 164 lb (74.4 kg) IBW/kg (Calculated) : 79.9  Temp (24hrs), Avg:98.5 F (36.9 C), Min:98.2 F (36.8 C), Max:98.9 F (37.2 C)   Recent Labs Lab 04/21/17 1344 04/21/17 1845 04/22/17 0400 04/23/17 0450 04/24/17 0404  WBC 16.4*  --  18.6* 10.4 7.6  CREATININE 0.87  --  0.61  --   --   LATICACIDVEN 1.4 1.4  --   --   --   VANCOTROUGH  --   --   --  <4*  --     Estimated Creatinine Clearance: 86.5 mL/min (by C-G formula based on SCr of 0.61 mg/dL).    No Known Allergies  Antimicrobials this admission: 6/18 Zosyn >>       >>    Dose adjustments this admission:    Microbiology results: 6.15 BCx: E.coli, Klebsiella oxytoca, enterococcus casseliflavus 6/15 UCx: neg    Sputum:      MRSA PCR:    Thank you for allowing pharmacy to be a part of this patient's care.  Maurizio Geno A 04/24/2017 5:15 PM

## 2017-04-24 NOTE — Clinical Social Work Note (Signed)
Clinical Social Work Assessment  Patient Details  Name: Elijah Evans MRN: [id number] Date of Birth: 16-Aug-1946  Date of referral:  04/24/17               Reason for consult:  Facility Placement                Permission sought to share information with:    Permission granted to share information::     Name::        Agency::     Relationship::     Contact Information:     Housing/Transportation Living arrangements for the past 2 months:  [place] of Information:  Engineer, materials, Adult Children Patient Interpreter Needed:  None Criminal Activity/Legal Involvement Pertinent to Current Situation/Hospitalization:  No - Comment as needed Significant Relationships:  Neighbor, Adult Children Lives with:  Facility Resident Do you feel safe going back to the place where you live?  Yes Need for family participation in patient care:  Yes (Comment)  Care giving concerns:  Patient resides at [facility].   Social Worker assessment / plan:  CSW spoke with patient's daughter, [name], via phone. [name] states that patient's baseline is using his motorized wheelchair to get around at [name] and that he doesn't ambulate. [name] stated that she does not have any concerns with him returning when time. CSW will continue to follow and will touch base with [facility] to ensure that they can take patient back when time. Fl2 started.  Employment status:  Retired Forensic scientist:  Medicare PT Recommendations:  Not assessed at this time Information / Referral to community resources:     Patient/Family's Response to care:  Patient's daughter expressed appreciation for [place] phone call.   Patient/Family's Understanding of and Emotional Response to Diagnosis, Current Treatment, and Prognosis:  Patient's daughter is in agreement with patient returning to [facility] when time and is appreciative of call care being given at hospital.  Emotional Assessment Appearance:   Appears stated age Attitude/Demeanor/Rapport:   (patient sleeping at time of visit) Affect (typically observed):  Calm Orientation:  Fluctuating Orientation (Suspected and/or reported Sundowners) Alcohol / Substance use:  Not Applicable Psych involvement (Current and /or in the community):  No (Comment)  Discharge Needs  Concerns to be addressed:  Care Coordination Readmission within the last 30 days:  No Current discharge risk:  None Barriers to Discharge:  No Barriers Identified   Shela Leff, LCSW 04/24/2017, 11:59 AM

## 2017-04-24 NOTE — Progress Notes (Signed)
[facility] [place] NAME: Elijah Evans    MR#:  [id number]  DATE OF BIRTH:  10-Oct-1946  SUBJECTIVE:   Patient here due to abdominal pain and noted to be hypotensive and suspected to have colitis and sepsis. A very poor historian. Denies any abdominal pain nausea or vomiting. Tolerating regular diet well but no appetite and PO intake poor.  No other acute events overnight.   REVIEW OF SYSTEMS:    Review of Systems  Constitutional: Negative for chills and fever.  HENT: Negative for congestion and tinnitus.   Eyes: Negative for blurred vision and double vision.  Respiratory: Negative for cough, shortness of breath and wheezing.   Cardiovascular: Negative for chest pain, orthopnea and PND.  Gastrointestinal: Negative for abdominal pain, diarrhea, nausea and vomiting.  Genitourinary: Negative for dysuria and hematuria.  Neurological: Negative for dizziness, sensory change and focal weakness.  All other systems reviewed and are negative.   Nutrition: Regular Tolerating Diet: Yes Tolerating PT: Wheelchair bound  DRUG ALLERGIES:  No Known Allergies  VITALS:  Blood pressure (!) 159/76, pulse 81, temperature 98.9 F (37.2 C), temperature source Oral, resp. rate 16, height 6\' 1"  (1.854 m), weight 74.4 kg (164 lb), SpO2 98 %.  PHYSICAL EXAMINATION:   Physical Exam  GENERAL:  71 y.o.-year-old patient lying in bed in no acute distress.  EYES: Pupils equal, round, reactive to light and accommodation. No scleral icterus. Extraocular muscles intact.  HEENT: Head atraumatic, normocephalic. Oropharynx and nasopharynx clear.  NECK:  Supple, no jugular venous distention. No thyroid enlargement, no tenderness.  LUNGS: Normal breath sounds bilaterally, no wheezing, rales, rhonchi. No use of accessory muscles of respiration.  CARDIOVASCULAR: S1, S2 normal. No murmurs, rubs, or gallops.  ABDOMEN: Soft, nontender, nondistended. Bowel sounds present. No  organomegaly or mass.  EXTREMITIES: No cyanosis, clubbing or edema b/l.    NEUROLOGIC: Cranial nerves II through XII are intact. No focal Motor or sensory deficits b/l. Globally weak PSYCHIATRIC: The patient is alert and oriented x 3.  SKIN: No obvious rash, lesion, or ulcer.    LABORATORY PANEL:   CBC  Recent Labs Lab 04/24/17 0404  WBC 7.6  HGB 10.2*  HCT 30.0*  PLT 143*   ------------------------------------------------------------------------------------------------------------------  Chemistries   Recent Labs Lab 04/21/17 1344 04/22/17 0400 04/23/17 0450  NA 131* 135  --   K 3.6 3.3* 3.2*  CL 98* 105  --   CO2 24 24  --   GLUCOSE 118* 105*  --   BUN 15 14  --   CREATININE 0.87 0.61  --   CALCIUM 8.5* 7.8*  --   MG  --   --  1.8  AST 203*  --   --   ALT 127*  --   --   ALKPHOS 263*  --   --   BILITOT 3.2*  --   --    ------------------------------------------------------------------------------------------------------------------  Cardiac Enzymes  Recent Labs Lab 04/22/17 0950  TROPONINI 0.03*   ------------------------------------------------------------------------------------------------------------------  RADIOLOGY:  No results found.   ASSESSMENT AND PLAN:   71 year old male previous history of TIA/CVA, history of seizures, hypertension, dementia, osteoarthritis of presented to the hospital due to abdominal pain and hypotension.  1. Sepsis-this was the working diagnosis given the patient's hypotension, leukocytosis and abdominal pain. -CT abdomen pelvis although was negative for any acute pathology. The source of sepsis  remains unclear presently. -Blood cultures are positive for E. coli, Klebsiella but source unclear. UA (-)  and no acute GI symptoms.  - cont. IV Meropenem and await further ID input.   2. Abdominal pain-etiology unclear. CT scan of the abdomen pelvis/abdominal ultrasound negative for acute pathology. Abdominal pain has now  resolved. -Patient is tolerating a regular diet well.   3. Bacteremia - pt. Has E. Coli/Klebsiella in his blood but source unclear presently.  - cont. IV Meropenem for now.  Await further ID input.   4. History of seizures-no acute seizures presently. Continue Vimpat, Tegretol.  5. Hyperlipidemia-continue simvastatin.  6. History of previous CVA-continue simvastatin, Aggrenox.   All the records are reviewed and case discussed with Care Management/Social Worker. Management plans discussed with the patient, family and they are in agreement.  CODE STATUS: Full code  DVT Prophylaxis: Lovenox  TOTAL TIME TAKING CARE OF THIS PATIENT: 25 minutes.   POSSIBLE D/C IN 1-2 DAYS, DEPENDING ON CLINICAL CONDITION.   Henreitta Leber M.D on 04/24/2017 at 2:37 PM  Between 7am to 6pm - Pager - [phone]  After 6pm go to [email or web address] - Technical brewer [facility]  Office  [phone]  CC: Primary care physician; [facility]

## 2017-04-25 LAB — COMPREHENSIVE METABOLIC PANEL
ALT: 28 U/L (ref 17–63)
AST: 15 U/L (ref 15–41)
Albumin: 2.5 g/dL — ABNORMAL LOW (ref 3.5–5.0)
Alkaline Phosphatase: 128 U/L — ABNORMAL HIGH (ref 38–126)
Anion gap: 6 (ref 5–15)
BUN: 8 mg/dL (ref 6–20)
CHLORIDE: 100 mmol/L — AB (ref 101–111)
CO2: 26 mmol/L (ref 22–32)
Calcium: 7.8 mg/dL — ABNORMAL LOW (ref 8.9–10.3)
Creatinine, Ser: 0.54 mg/dL — ABNORMAL LOW (ref 0.61–1.24)
Glucose, Bld: 92 mg/dL (ref 65–99)
POTASSIUM: 3.4 mmol/L — AB (ref 3.5–5.1)
Sodium: 132 mmol/L — ABNORMAL LOW (ref 135–145)
Total Bilirubin: 0.8 mg/dL (ref 0.3–1.2)
Total Protein: 5.3 g/dL — ABNORMAL LOW (ref 6.5–8.1)

## 2017-04-25 LAB — CULTURE, BLOOD (ROUTINE X 2)

## 2017-04-25 MED ORDER — CIPROFLOXACIN HCL 500 MG PO TABS: 500.0000 mg | ORAL_TABLET | Freq: Two times a day (BID) | ORAL | 0 refills | Status: AC

## 2017-04-25 MED ORDER — HYDRALAZINE HCL 20 MG/ML IJ SOLN
10.0000 mg | Freq: Once | INTRAMUSCULAR | Status: AC
  Administered 2017-04-25: 10 mg via INTRAVENOUS
  Filled 2017-04-25: qty 1

## 2017-04-25 MED ORDER — AMOXICILLIN-POT CLAVULANATE 875-125 MG PO TABS: 1.0000 | ORAL_TABLET | Freq: Two times a day (BID) | ORAL | 0 refills | Status: AC

## 2017-04-25 MED ORDER — LOSARTAN POTASSIUM-HCTZ 100-12.5 MG PO TABS: 0.5000 | ORAL_TABLET | Freq: Every day | ORAL | Status: DC

## 2017-04-25 MED ORDER — HYDROCHLOROTHIAZIDE 10 MG/ML ORAL SUSPENSION
6.2500 mg | Freq: Every day | ORAL | Status: DC
  Administered 2017-04-25: 6.25 mg via ORAL
  Filled 2017-04-25 (×2): qty 1.25

## 2017-04-25 MED ORDER — LOSARTAN POTASSIUM 50 MG PO TABS
50.0000 mg | ORAL_TABLET | Freq: Every day | ORAL | Status: DC
  Administered 2017-04-25: 50 mg via ORAL
  Filled 2017-04-25: qty 1

## 2017-04-25 NOTE — Clinical Social Work Note (Signed)
PT assessed patient today and have recommended STR. CSW spoke with Museum/gallery conservator at [name] today and informed her of the PT assessment. Amber stated that they will not be able to take patient back until he can stand and pivot and that patient had been progressively getting worse at their facility. She stated patient will need to go to rehab before returning to [facility]. CSW has spoken to patient and patient's daughter this afternoon and a bed search was initiated and bed offers were extended. Patient's daughter chose [facility]. Rick at [facility] is aware and can take today. Physician discharged patient for today. Discharge information sent to [facility]. Patient to transport via EMS.  Daughter is aware that she will need to call [facility] to request patient's wheelchair be delivered to [facility]. Shela Leff MSW,LCSW [phone]

## 2017-04-25 NOTE — Progress Notes (Signed)
RN has reassessed pt.'s BP manually and received 170/90. Home BP medications were given at 0940 04/25/2017. Notified prime doc of findings, new orders to give hydralazine 10 mg IV once. Will continue to monitor pt.  Elijah Evans [employer]

## 2017-04-25 NOTE — Care Management Important Message (Signed)
Important Message  Patient Details  Name: Elijah Evans MRN: [id number] Date of Birth: 05/29/1946   Medicare Important Message Given:  Yes (signed and scanned in 6/18)    Beverly Sessions, RN 04/25/2017, 2:14 PM

## 2017-04-25 NOTE — Progress Notes (Signed)
Report called to receiving nurse, Rachel Moulds, at [facility]. EMS called for non emergant transfer. Awaiting transportation.   Elijah Evans [employer]

## 2017-04-25 NOTE — Progress Notes (Signed)
Pt prepared for d/c to SNF. IV d/c'd. Skin intact except as charted in most recent assessments. Vitals are stable. Report called to receiving facility. Pt to be transported by ambulance service.  Otniel Hoe  

## 2017-04-25 NOTE — Clinical Social Work Placement (Signed)
   CLINICAL SOCIAL WORK PLACEMENT  NOTE  Date:  04/25/2017  Patient Details  Name: Elijah Evans MRN: [id number] Date of Birth: 09/28/1946  Clinical Social Work is seeking post-discharge placement for this patient at the [place] level of care (*CSW will initial, date and re-position this form in  chart as items are completed):  Yes   Patient/family provided with [place] Work Department's list of facilities offering this level of care within the geographic area requested by the patient (or if unable, by the patient's family).  Yes   Patient/family informed of their freedom to choose among providers that offer the needed level of care, that participate in Medicare, Medicaid or managed care program needed by the patient, have an available bed and are willing to accept the patient.  Yes   Patient/family informed of 's ownership interest in [facility] and [facility], as well as of the fact that they are under no obligation to receive care at these facilities.  PASRR submitted to EDS on 04/25/17     PASRR number received on 04/25/17     Existing PASRR number confirmed on       FL2 transmitted to all facilities in geographic area requested by pt/family on 04/25/17     FL2 transmitted to all facilities within larger geographic area on       Patient informed that his/her managed care company has contracts with or will negotiate with certain facilities, including the following:        Yes   Patient/family informed of bed offers received.  Patient chooses bed at  [facility])     Physician recommends and patient chooses bed at  [facility])    Patient to be transferred to  (SNF) on 04/25/17.  Patient to be transferred to facility by  (EMS)     Patient family notified on 04/25/17 of transfer.  Name of family member notified:   (daughter)     PHYSICIAN       Additional Comment:     _______________________________________________ Shela Leff, LCSW 04/25/2017, 3:18 PM

## 2017-04-25 NOTE — Progress Notes (Signed)
[facility] INFECTIOUS DISEASE PROGRESS NOTE Date of Admission:  04/21/2017     ID: Elijah Evans is a 71 y.o. male with polymicrobial bacteremia Principal Problem:   Sepsis ([place]) Active Problems:   Abdominal pain   Seizures ([place])   HTN (hypertension)   Subjective: Feels better, no fevers, denies abd pain  ROS  Eleven systems are reviewed and negative except per hpi  Medications:  Antibiotics Given (last 72 hours)    Date/Time Action Medication Dose Rate   04/22/17 1714 New Bag/Given   meropenem (MERREM) 1 g in sodium chloride 0.9 % 100 mL IVPB 1 g 200 mL/hr   04/23/17 0258 New Bag/Given   meropenem (MERREM) 1 g in sodium chloride 0.9 % 100 mL IVPB 1 g 200 mL/hr   04/23/17 1022 New Bag/Given   meropenem (MERREM) 1 g in sodium chloride 0.9 % 100 mL IVPB 1 g 200 mL/hr   04/23/17 1813 New Bag/Given   meropenem (MERREM) 1 g in sodium chloride 0.9 % 100 mL IVPB 1 g 200 mL/hr   04/24/17 0226 New Bag/Given   meropenem (MERREM) 1 g in sodium chloride 0.9 % 100 mL IVPB 1 g 200 mL/hr   04/24/17 1010 New Bag/Given   meropenem (MERREM) 1 g in sodium chloride 0.9 % 100 mL IVPB 1 g 200 mL/hr   04/24/17 1824 New Bag/Given   piperacillin-tazobactam (ZOSYN) IVPB 3.375 g 3.375 g 12.5 mL/hr   04/25/17 0526 New Bag/Given   piperacillin-tazobactam (ZOSYN) IVPB 3.375 g 3.375 g 12.5 mL/hr   04/25/17 1349 New Bag/Given   piperacillin-tazobactam (ZOSYN) IVPB 3.375 g 3.375 g 12.5 mL/hr     . carbamazepine  400 mg Oral BID  . dipyridamole-aspirin  1 capsule Oral BID  . enoxaparin (LOVENOX) injection  40 mg Subcutaneous Q24H  . losartan  50 mg Oral Daily   And  . hydrochlorothiazide  6.25 mg Oral Daily  . lacosamide  100 mg Oral BID  . simvastatin  40 mg Oral QHS    Objective: Vital signs in last 24 hours: Temp:  [98.2 F (36.8 C)-98.4 F (36.9 C)] 98.2 F (36.8 C) (06/19 1300) Pulse Rate:  [74-77] 75 (06/19 1300) Resp:  [17-20] 17 (06/19 1300) BP: (130-170)/(60-90) 134/65  (06/19 1300) SpO2:  [97 %-99 %] 98 % (06/19 1300) Constitutional: He is disheveled, very HOH, confused HENT: mild icterus Mouth/Throat: Oropharynx is clear and dry. No oropharyngeal exudate.  Cardiovascular: Normal rate, regular rhythm and normal heart sounds. Pulmonary/Chest: Effort normal and breath sounds normal. No respiratory distress. He has no wheezes.  Abdominal: Soft. Obese, non tender  Bowel sounds are normal. He exhibits no distension. Lymphadenopathy: He has no cervical adenopathy.  Neurological: He is alert - somewhat confused, HOH Skin: Skin is warm and dry. No rash noted. No erythema.  Psychiatric: He has a normal mood and affect. His behavior is normal.   Lab Results  Recent Labs  04/23/17 0450 04/24/17 0404 04/25/17 0451  WBC 10.4 7.6  --   HGB 10.3* 10.2*  --   HCT 30.0* 30.0*  --   NA  --   --  132*  K 3.2*  --  3.4*  CL  --   --  100*  CO2  --   --  26  BUN  --   --  8  CREATININE  --   --  0.54*    Microbiology: Results for orders placed or performed during the hospital encounter of 04/21/17  Blood Culture (  routine x 2)     Status: Abnormal (Preliminary result)   Collection Time: 04/21/17  1:48 PM  Result Value Ref Range Status   Specimen Description LEFT ANTECUBITAL  Final   Special Requests   Final    BOTTLES DRAWN AEROBIC AND ANAEROBIC Blood Culture results may not be optimal due to an excessive volume of blood received in culture bottles   Culture  Setup Time   Final    GRAM NEGATIVE RODS IN BOTH AEROBIC AND ANAEROBIC BOTTLES CRITICAL RESULT CALLED TO, READ BACK BY AND VERIFIED WITH: MATT MCBANE AT 0120 ON 04/22/17 RWW CONFIRMED BY [facility]    Culture (A)  Final    ESCHERICHIA COLI KLEBSIELLA OXYTOCA GRAM POSITIVE COCCI IDENTIFICATION TO FOLLOW FOR GRAM POSITIVE COCCI SUSCEPTIBILITIES PERFORMED ON PREVIOUS CULTURE WITHIN THE LAST 5 DAYS. FOR E. COLI AND KLEB. OXYTOCA CRITICAL RESULT CALLED TO, READ BACK BY AND VERIFIED WITH: H ZOMPA,PHARMD AT  1027 04/23/17 BY L BENFIELD CONCERNING GROWTH ON CULTURE Performed at [facility], [place] [address], [place], [place] [address]    Report Status PENDING  Incomplete  Blood Culture ID Panel (Reflexed)     Status: Abnormal   Collection Time: 04/21/17  1:48 PM  Result Value Ref Range Status   Enterococcus species NOT DETECTED NOT DETECTED Final   Listeria monocytogenes NOT DETECTED NOT DETECTED Final   Staphylococcus species NOT DETECTED NOT DETECTED Final   Staphylococcus aureus NOT DETECTED NOT DETECTED Final   Streptococcus species NOT DETECTED NOT DETECTED Final   Streptococcus agalactiae NOT DETECTED NOT DETECTED Final   Streptococcus pneumoniae NOT DETECTED NOT DETECTED Final   Streptococcus pyogenes NOT DETECTED NOT DETECTED Final   Acinetobacter baumannii NOT DETECTED NOT DETECTED Final   Enterobacteriaceae species DETECTED (A) NOT DETECTED Final    Comment: CRITICAL RESULT CALLED TO, READ BACK BY AND VERIFIED WITH: MATT MCBANE AT 0120 ON 04/22/17 RWW    Enterobacter cloacae complex NOT DETECTED NOT DETECTED Final   Escherichia coli DETECTED (A) NOT DETECTED Final    Comment: CRITICAL RESULT CALLED TO, READ BACK BY AND VERIFIED WITH: MATT MCBANE AT 0120 ON 04/22/17 RWW    Klebsiella oxytoca DETECTED (A) NOT DETECTED Final    Comment: CRITICAL RESULT CALLED TO, READ BACK BY AND VERIFIED WITH: MATT MCBANE AT 0120 ON 04/22/17 RWW    Klebsiella pneumoniae NOT DETECTED NOT DETECTED Final   Proteus species NOT DETECTED NOT DETECTED Final   Serratia marcescens NOT DETECTED NOT DETECTED Final   Carbapenem resistance NOT DETECTED NOT DETECTED Final   Haemophilus influenzae NOT DETECTED NOT DETECTED Final   Neisseria meningitidis NOT DETECTED NOT DETECTED Final   Pseudomonas aeruginosa NOT DETECTED NOT DETECTED Final   Candida albicans NOT DETECTED NOT DETECTED Final   Candida glabrata NOT DETECTED NOT DETECTED Final   Candida krusei NOT DETECTED NOT DETECTED Final   Candida  parapsilosis NOT DETECTED NOT DETECTED Final   Candida tropicalis NOT DETECTED NOT DETECTED Final  Urine culture     Status: None   Collection Time: 04/21/17  2:02 PM  Result Value Ref Range Status   Specimen Description URINE, RANDOM  Final   Special Requests NONE  Final   Culture   Final    NO GROWTH Performed at [facility], [address] [place], [place] [address]    Report Status 04/23/2017 FINAL  Final  Blood Culture (routine x 2)     Status: Abnormal   Collection Time: 04/21/17  2:03  PM  Result Value Ref Range Status   Specimen Description BLOOD RIGHT ARM  Final   Special Requests   Final    BOTTLES DRAWN AEROBIC AND ANAEROBIC Blood Culture results may not be optimal due to an excessive volume of blood received in culture bottles   Culture  Setup Time   Final    GRAM NEGATIVE RODS IN BOTH AEROBIC AND ANAEROBIC BOTTLES CRITICAL VALUE NOTED.  VALUE IS CONSISTENT WITH PREVIOUSLY REPORTED AND CALLED VALUE. CONFIRMED BY [facility]    Culture (A)  Final    ESCHERICHIA COLI KLEBSIELLA OXYTOCA ENTEROCOCCUS CASSELIFLAVUS CRITICAL RESULT CALLED TO, READ BACK BY AND VERIFIED WITH: H ZOMPA,PHARMD AT 1028 04/23/17 BY L BENFIELD CONCERNING GROWTH ON CULTURE Performed at [facility], [place] [address] [place], [place] [address]    Report Status 04/25/2017 FINAL  Final   Organism ID, Bacteria ESCHERICHIA COLI  Final   Organism ID, Bacteria KLEBSIELLA OXYTOCA  Final   Organism ID, Bacteria ENTEROCOCCUS CASSELIFLAVUS  Final      Susceptibility   Escherichia coli - MIC*    AMPICILLIN >=32 RESISTANT Resistant     CEFAZOLIN <=4 SENSITIVE Sensitive     CEFEPIME <=1 SENSITIVE Sensitive     CEFTAZIDIME <=1 SENSITIVE Sensitive     CEFTRIAXONE <=1 SENSITIVE Sensitive     CIPROFLOXACIN <=0.25 SENSITIVE Sensitive     GENTAMICIN <=1 SENSITIVE Sensitive     IMIPENEM <=0.25 SENSITIVE Sensitive     TRIMETH/SULFA >=320 RESISTANT Resistant     AMPICILLIN/SULBACTAM >=32 RESISTANT Resistant      PIP/TAZO <=4 SENSITIVE Sensitive     Extended ESBL NEGATIVE Sensitive     * ESCHERICHIA COLI   Enterococcus casseliflavus - MIC*    AMPICILLIN <=2 SENSITIVE Sensitive     VANCOMYCIN RESISTANT Resistant     GENTAMICIN SYNERGY SENSITIVE Sensitive     LINEZOLID 2 SENSITIVE Sensitive     * ENTEROCOCCUS CASSELIFLAVUS   Klebsiella oxytoca - MIC*    AMPICILLIN >=32 RESISTANT Resistant     CEFAZOLIN >=64 RESISTANT Resistant     CEFEPIME <=1 SENSITIVE Sensitive     CEFTAZIDIME <=1 SENSITIVE Sensitive     CEFTRIAXONE <=1 SENSITIVE Sensitive     CIPROFLOXACIN <=0.25 SENSITIVE Sensitive     GENTAMICIN <=1 SENSITIVE Sensitive     IMIPENEM <=0.25 SENSITIVE Sensitive     TRIMETH/SULFA <=20 SENSITIVE Sensitive     AMPICILLIN/SULBACTAM 16 INTERMEDIATE Intermediate     PIP/TAZO <=4 SENSITIVE Sensitive     Extended ESBL NEGATIVE Sensitive     * KLEBSIELLA OXYTOCA    Studies/Results: No results found.  Assessment/Plan: Elijah Evans is a 71 y.o. male admitted with L sided abd pain - upper and lower, as well as leukocytosis, elevated LFTs and chronic findings on CT in [place]. 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 I am not sure however he is a reliable historian.   Repeat LFTs nml. NO fevers, wbc nml.  Recommendations Can dc on oral regimen - cipro would cover both E coli and Kleb  -  augmentin could cover the enterococcus and also cover anaerobes. Rec a  10-14 day course Will need colonoscopy and further evaluation but this could likely be done as otpt.  Thank you very much for the consult. Will follow with you.  [place], [place] Sallade P   04/25/2017, 3:28 PM

## 2017-04-25 NOTE — Discharge Summary (Signed)
[place] [facility] [place] NAME: Elijah Evans    MR#:  [id number]  DATE OF BIRTH:  1945/11/23  DATE OF ADMISSION:  04/21/2017 ADMITTING PHYSICIAN: Lance Coon, MD  DATE OF DISCHARGE: 04/25/2017  PRIMARY CARE PHYSICIAN: [place], [facility]    ADMISSION DIAGNOSIS:  Abdominal pain, unspecified abdominal location [R10.9] Hypotension, unspecified hypotension type [I95.9]  DISCHARGE DIAGNOSIS:  Principal Problem:   Sepsis ([place]) Active Problems:   Abdominal pain   Seizures (HCC)   HTN (hypertension)   SECONDARY DIAGNOSIS:   Past Medical History:  Diagnosis Date  . Arthritis    osteo - pelvic area  . Dementia    early stages  . Hypertension   . Left leg weakness    S/P TIA  . Motion sickness    cars  . Seizures ([place])   . Shortness of breath dyspnea   . Stroke ([place])   . TIA (transient ischemic attack)    X3 - last one approx 2012  . Wears dentures    full upper and lower    HOSPITAL COURSE:   71 year old male previous history of TIA/CVA, history of seizures, hypertension, dementia, osteoarthritis of presented to the hospital due to abdominal pain and hypotension.  1. Sepsis-this was the working diagnosis given the patient's hypotension, leukocytosis and abdominal pain. -CT abdomen pelvis although was negative for any acute pathology except for some mild ileum inflammation. The source of sepsis  remains unclear presently But suspected to be GI related. -Blood cultures were positive for E. coli, Klebsiella, Enterobacter.  Patient was treated with IV meropenem, infectious disease consult was obtained. The recommended discharging the patient on oral ciprofloxacin, Augmentin with follow-up with gastroenterology as an outpatient.  2. Abdominal pain- etiology unclear but suspected to be related to colitis, patient had CT abdomen and pelvis which showed some mild ileal inflammation. Abdominal ultrasound negative for acute pathology.  Patient's Abdominal pain has now resolved. -Patient is tolerating a regular diet well.   3. Bacteremia - pt. Has E. Coli/Klebsiella, Enterobacter  in his blood cirrhosis thought to be GI related. -Infectious disease consult obtained. Patient maintained on meropenem and not being discharged on oral ciprofloxacin and Augmentin for additional 10 days. He will follow up with gastroenterology for possible workup and need for colonoscopy.  4. History of seizures-no acute seizures presently. He will Continue Vimpat, Tegretol.  5. Hyperlipidemia- he will continue simvastatin.  6. History of previous CVA- he will continue simvastatin, Aggrenox.  7. Essential HtN - pt. Will resume his Losartan/HCTZ.   DISCHARGE CONDITIONS:   Stable  CONSULTS OBTAINED:  Treatment Team:  Leonel Ramsay, MD  DRUG ALLERGIES:  No Known Allergies  DISCHARGE MEDICATIONS:   Allergies as of 04/25/2017   No Known Allergies     Medication List    TAKE these medications   acetaminophen 325 MG tablet Commonly known as:  TYLENOL Take 2 tablets (650 mg total) by mouth every 4 (four) hours as needed for mild pain, moderate pain or headache.   albuterol 108 (90 Base) MCG/ACT inhaler Commonly known as:  PROVENTIL HFA;VENTOLIN HFA Inhale 2 puffs into the lungs every 6 (six) hours as needed for wheezing or shortness of breath.   amoxicillin-clavulanate 875-125 MG tablet Commonly known as:  AUGMENTIN Take 1 tablet by mouth 2 (two) times daily.   carbamazepine 200 MG tablet Commonly known as:  TEGRETOL Take 400 mg by mouth 2 (two) times daily.   ciprofloxacin 500 MG tablet  Commonly known as:  CIPRO Take 1 tablet (500 mg total) by mouth 2 (two) times daily.   dipyridamole-aspirin 200-25 MG 12hr capsule Commonly known as:  AGGRENOX Take 1 capsule by mouth 2 (two) times daily.   fluticasone 50 MCG/ACT nasal spray Commonly known as:  FLONASE Place into both nostrils daily.   Lacosamide 100 MG  Tabs Take 1 tablet (100 mg total) by mouth 2 (two) times daily.   losartan-hydrochlorothiazide 100-12.5 MG tablet Commonly known as:  HYZAAR Take 0.5 tablets by mouth daily.   magnesium hydroxide 400 MG/5ML suspension Commonly known as:  MILK OF MAGNESIA Take 30 mLs by mouth daily as needed for moderate constipation.   meclizine 32 MG tablet Commonly known as:  ANTIVERT Take 1 tablet (32 mg total) by mouth 3 (three) times daily as needed.   ondansetron 4 MG disintegrating tablet Commonly known as:  ZOFRAN ODT Take 1 tablet (4 mg total) by mouth every 8 (eight) hours as needed for nausea or vomiting.   simvastatin 40 MG tablet Commonly known as:  ZOCOR Take 40 mg by mouth at bedtime.   vitamin B-12 1000 MCG tablet Commonly known as:  CYANOCOBALAMIN Take 1,000 mcg by mouth daily.         DISCHARGE INSTRUCTIONS:   DIET:  Cardiac diet  DISCHARGE CONDITION:  Stable  ACTIVITY:  Activity as tolerated  OXYGEN:  Home Oxygen: No.   Oxygen Delivery: room air  DISCHARGE LOCATION:  Assisted Living.    If you experience worsening of your admission symptoms, develop shortness of breath, life threatening emergency, suicidal or homicidal thoughts you must seek medical attention immediately by calling 911 or calling your MD immediately  if symptoms less severe.  You Must read complete instructions/literature along with all the possible adverse reactions/side effects for all the Medicines you take and that have been prescribed to you. Take any new Medicines after you have completely understood and accpet all the possible adverse reactions/side effects.   Please note  You were cared for by a hospitalist during your hospital stay. If you have any questions about your discharge medications or the care you received while you were in the hospital after you are discharged, you can call the unit and asked to speak with the hospitalist on call if the hospitalist that took care of you is  not available. Once you are discharged, your primary care physician will handle any further medical issues. Please note that NO REFILLS for any discharge medications will be authorized once you are discharged, as it is imperative that you return to your primary care physician (or establish a relationship with a primary care physician if you do not have one) for your aftercare needs so that they can reassess your need for medications and monitor your lab values.     Today   No abdominal pain, N/V.  No fever.  No other complaints presently.   VITAL SIGNS:  Blood pressure (!) 170/90, pulse 74, temperature 98.4 F (36.9 C), temperature source Oral, resp. rate 20, height 6\' 1"  (1.854 m), weight 74.4 kg (164 lb), SpO2 97 %.  I/O:    Intake/Output Summary (Last 24 hours) at 04/25/17 1216 Last data filed at 04/25/17 0958  Gross per 24 hour  Intake              305 ml  Output                0 ml  Net  305 ml    PHYSICAL EXAMINATION:    GENERAL:  71 y.o.-year-old patient lying in bed in no acute distress.  EYES: Pupils equal, round, reactive to light and accommodation. No scleral icterus. Extraocular muscles intact.  HEENT: Head atraumatic, normocephalic. Oropharynx and nasopharynx clear.  NECK:  Supple, no jugular venous distention. No thyroid enlargement, no tenderness.  LUNGS: Normal breath sounds bilaterally, no wheezing, rales, rhonchi. No use of accessory muscles of respiration.  CARDIOVASCULAR: S1, S2 normal. No murmurs, rubs, or gallops.  ABDOMEN: Soft, nontender, nondistended. Bowel sounds present. No organomegaly or mass.  EXTREMITIES: No cyanosis, clubbing or edema b/l.    NEUROLOGIC: Cranial nerves II through XII are intact. No focal Motor or sensory deficits b/l. Globally weak PSYCHIATRIC: The patient is alert and oriented x 3.  SKIN: No obvious rash, lesion, or ulcer.   DATA REVIEW:   CBC  Recent Labs Lab 04/24/17 0404  WBC 7.6  HGB 10.2*  HCT 30.0*   PLT 143*    Chemistries   Recent Labs Lab 04/23/17 0450 04/25/17 0451  NA  --  132*  K 3.2* 3.4*  CL  --  100*  CO2  --  26  GLUCOSE  --  92  BUN  --  8  CREATININE  --  0.54*  CALCIUM  --  7.8*  MG 1.8  --   AST  --  15  ALT  --  28  ALKPHOS  --  128*  BILITOT  --  0.8    Cardiac Enzymes  Recent Labs Lab 04/22/17 0950  TROPONINI 0.03*    Microbiology Results  Results for orders placed or performed during the hospital encounter of 04/21/17  Blood Culture (routine x 2)     Status: Abnormal (Preliminary result)   Collection Time: 04/21/17  1:48 PM  Result Value Ref Range Status   Specimen Description LEFT ANTECUBITAL  Final   Special Requests   Final    BOTTLES DRAWN AEROBIC AND ANAEROBIC Blood Culture results may not be optimal due to an excessive volume of blood received in culture bottles   Culture  Setup Time   Final    GRAM NEGATIVE RODS IN BOTH AEROBIC AND ANAEROBIC BOTTLES CRITICAL RESULT CALLED TO, READ BACK BY AND VERIFIED WITH: MATT MCBANE AT 0120 ON 04/22/17 RWW CONFIRMED BY [facility]    Culture (A)  Final    ESCHERICHIA COLI KLEBSIELLA OXYTOCA GRAM POSITIVE COCCI IDENTIFICATION TO FOLLOW FOR GRAM POSITIVE COCCI SUSCEPTIBILITIES PERFORMED ON PREVIOUS CULTURE WITHIN THE LAST 5 DAYS. FOR E. COLI AND KLEB. OXYTOCA CRITICAL RESULT CALLED TO, READ BACK BY AND VERIFIED WITH: H ZOMPA,PHARMD AT 1027 04/23/17 BY L BENFIELD CONCERNING GROWTH ON CULTURE Performed at [facility], [place] [address] [place], [place] [address]    Report Status PENDING  Incomplete  Blood Culture ID Panel (Reflexed)     Status: Abnormal   Collection Time: 04/21/17  1:48 PM  Result Value Ref Range Status   Enterococcus species NOT DETECTED NOT DETECTED Final   Listeria monocytogenes NOT DETECTED NOT DETECTED Final   Staphylococcus species NOT DETECTED NOT DETECTED Final   Staphylococcus aureus NOT DETECTED NOT DETECTED Final   Streptococcus species NOT DETECTED NOT DETECTED Final    Streptococcus agalactiae NOT DETECTED NOT DETECTED Final   Streptococcus pneumoniae NOT DETECTED NOT DETECTED Final   Streptococcus pyogenes NOT DETECTED NOT DETECTED Final   Acinetobacter baumannii NOT DETECTED NOT DETECTED Final   Enterobacteriaceae species DETECTED (A) NOT DETECTED  Final    Comment: CRITICAL RESULT CALLED TO, READ BACK BY AND VERIFIED WITH: MATT MCBANE AT 0120 ON 04/22/17 RWW    Enterobacter cloacae complex NOT DETECTED NOT DETECTED Final   Escherichia coli DETECTED (A) NOT DETECTED Final    Comment: CRITICAL RESULT CALLED TO, READ BACK BY AND VERIFIED WITH: MATT MCBANE AT 0120 ON 04/22/17 RWW    Klebsiella oxytoca DETECTED (A) NOT DETECTED Final    Comment: CRITICAL RESULT CALLED TO, READ BACK BY AND VERIFIED WITH: MATT MCBANE AT 0120 ON 04/22/17 RWW    Klebsiella pneumoniae NOT DETECTED NOT DETECTED Final   Proteus species NOT DETECTED NOT DETECTED Final   Serratia marcescens NOT DETECTED NOT DETECTED Final   Carbapenem resistance NOT DETECTED NOT DETECTED Final   Haemophilus influenzae NOT DETECTED NOT DETECTED Final   Neisseria meningitidis NOT DETECTED NOT DETECTED Final   Pseudomonas aeruginosa NOT DETECTED NOT DETECTED Final   Candida albicans NOT DETECTED NOT DETECTED Final   Candida glabrata NOT DETECTED NOT DETECTED Final   Candida krusei NOT DETECTED NOT DETECTED Final   Candida parapsilosis NOT DETECTED NOT DETECTED Final   Candida tropicalis NOT DETECTED NOT DETECTED Final  Urine culture     Status: None   Collection Time: 04/21/17  2:02 PM  Result Value Ref Range Status   Specimen Description URINE, RANDOM  Final   Special Requests NONE  Final   Culture   Final    NO GROWTH Performed at [facility], [address] [place], [place] [address]    Report Status 04/23/2017 FINAL  Final  Blood Culture (routine x 2)     Status: Abnormal   Collection Time: 04/21/17  2:03 PM  Result Value Ref Range Status   Specimen Description BLOOD RIGHT  ARM  Final   Special Requests   Final    BOTTLES DRAWN AEROBIC AND ANAEROBIC Blood Culture results may not be optimal due to an excessive volume of blood received in culture bottles   Culture  Setup Time   Final    GRAM NEGATIVE RODS IN BOTH AEROBIC AND ANAEROBIC BOTTLES CRITICAL VALUE NOTED.  VALUE IS CONSISTENT WITH PREVIOUSLY REPORTED AND CALLED VALUE. CONFIRMED BY [facility]    Culture (A)  Final    ESCHERICHIA COLI KLEBSIELLA OXYTOCA ENTEROCOCCUS CASSELIFLAVUS CRITICAL RESULT CALLED TO, READ BACK BY AND VERIFIED WITH: H ZOMPA,PHARMD AT 1028 04/23/17 BY L BENFIELD CONCERNING GROWTH ON CULTURE Performed at [facility], [place] [address], [place], [place] [address]    Report Status 04/25/2017 FINAL  Final   Organism ID, Bacteria ESCHERICHIA COLI  Final   Organism ID, Bacteria KLEBSIELLA OXYTOCA  Final   Organism ID, Bacteria ENTEROCOCCUS CASSELIFLAVUS  Final      Susceptibility   Escherichia coli - MIC*    AMPICILLIN >=32 RESISTANT Resistant     CEFAZOLIN <=4 SENSITIVE Sensitive     CEFEPIME <=1 SENSITIVE Sensitive     CEFTAZIDIME <=1 SENSITIVE Sensitive     CEFTRIAXONE <=1 SENSITIVE Sensitive     CIPROFLOXACIN <=0.25 SENSITIVE Sensitive     GENTAMICIN <=1 SENSITIVE Sensitive     IMIPENEM <=0.25 SENSITIVE Sensitive     TRIMETH/SULFA >=320 RESISTANT Resistant     AMPICILLIN/SULBACTAM >=32 RESISTANT Resistant     PIP/TAZO <=4 SENSITIVE Sensitive     Extended ESBL NEGATIVE Sensitive     * ESCHERICHIA COLI   Enterococcus casseliflavus - MIC*    AMPICILLIN <=2 SENSITIVE Sensitive     VANCOMYCIN RESISTANT  Resistant     GENTAMICIN SYNERGY SENSITIVE Sensitive     LINEZOLID 2 SENSITIVE Sensitive     * ENTEROCOCCUS CASSELIFLAVUS   Klebsiella oxytoca - MIC*    AMPICILLIN >=32 RESISTANT Resistant     CEFAZOLIN >=64 RESISTANT Resistant     CEFEPIME <=1 SENSITIVE Sensitive     CEFTAZIDIME <=1 SENSITIVE Sensitive     CEFTRIAXONE <=1 SENSITIVE Sensitive     CIPROFLOXACIN <=0.25 SENSITIVE  Sensitive     GENTAMICIN <=1 SENSITIVE Sensitive     IMIPENEM <=0.25 SENSITIVE Sensitive     TRIMETH/SULFA <=20 SENSITIVE Sensitive     AMPICILLIN/SULBACTAM 16 INTERMEDIATE Intermediate     PIP/TAZO <=4 SENSITIVE Sensitive     Extended ESBL NEGATIVE Sensitive     * KLEBSIELLA OXYTOCA    RADIOLOGY:  No results found.    Management plans discussed with the patient, family and they are in agreement.  CODE STATUS:     Code Status Orders        Start     Ordered   04/21/17 2221  Full code  Continuous     04/21/17 2220    TOTAL TIME TAKING CARE OF THIS PATIENT: 40 minutes.    Henreitta Leber M.D on 04/25/2017 at 12:16 PM  Between 7am to 6pm - Pager - [phone]  After 6pm go to [email or web address] - [employer] bre[employer]  Hospitalists  Office  [phone]  CC: Primary care physician; [facility]

## 2017-04-25 NOTE — NC FL2 (Signed)
[place] LEVEL OF CARE SCREENING TOOL     IDENTIFICATION  Patient Name: Elijah Evans Birthdate: 05-15-1946 Sex: male Admission Date (Current Location): 04/21/2017  [facility] and [place] Number:  Engineering geologist and Address:  [facility], [address], [place], [place] [address]      Provider Number: [id number]  Attending Physician Name and Address:  Henreitta Leber, MD  Relative Name and Phone Number:       Current Level of Care: Hospital Recommended Level of Care: [place] Prior Approval Number:    Date Approved/Denied:   PASRR Number:    Discharge Plan:      Current Diagnoses: Patient Active Problem List   Diagnosis Date Noted  . Abdominal pain 04/21/2017  . Seizures ([place]) 04/21/2017  . HTN (hypertension) 04/21/2017  . Sepsis ([place]) 04/21/2017  . Dilantin toxicity 03/11/2017  . SBO (small bowel obstruction) ([place])   . Small bowel obstruction ([place]) 12/08/2015    Orientation RESPIRATION BLADDER Height & Weight     Self, Time, Place  Normal Incontinent Weight: 164 lb (74.4 kg) Height:  6\' 1"  (185.4 cm)  BEHAVIORAL SYMPTOMS/MOOD NEUROLOGICAL BOWEL NUTRITION STATUS   (none)  (none) Continent Diet (cardiac)  AMBULATORY STATUS COMMUNICATION OF NEEDS Skin   Extensive Assist (plus two stand and pivot) Verbally Normal                       Personal Care Assistance Level of Assistance  Bathing, Dressing Bathing Assistance: Maximum assistance   Dressing Assistance: Maximum assistance     Functional Limitations Info  Hearing, Sight Sight Info: Impaired Hearing Info: Impaired      SPECIAL CARE FACTORS FREQUENCY  PT (By licensed PT)                    Contractures Contractures Info: Not present    Additional Factors Info                  Current Medications (04/25/2017):  This is the current hospital active medication list Current Facility-Administered Medications   Medication Dose Route Frequency Provider Last Rate Last Dose  . acetaminophen (TYLENOL) tablet 650 mg  650 mg Oral Q6H PRN Lance Coon, MD       Or  . acetaminophen (TYLENOL) suppository 650 mg  650 mg Rectal Q6H PRN Lance Coon, MD      . carbamazepine (TEGRETOL) tablet 400 mg  400 mg Oral BID Lance Coon, MD   400 mg at 04/25/17 9798  . dipyridamole-aspirin (AGGRENOX) 200-25 MG per 12 hr capsule 1 capsule  1 capsule Oral BID Lance Coon, MD   1 capsule at 04/25/17 0939  . enoxaparin (LOVENOX) injection 40 mg  40 mg Subcutaneous Q24H Lance Coon, MD   40 mg at 04/24/17 2240  . losartan (COZAAR) tablet 50 mg  50 mg Oral Daily Henreitta Leber, MD   50 mg at 04/25/17 9211   And  . hydrochlorothiazide 10 mg/mL oral suspension 6.25 mg  6.25 mg Oral Daily Henreitta Leber, MD   6.25 mg at 04/25/17 0939  . ipratropium-albuterol (DUONEB) 0.5-2.5 (3) MG/3ML nebulizer solution 3 mL  3 mL Nebulization Q4H PRN Lance Coon, MD      . lacosamide (VIMPAT) tablet 100 mg  100 mg Oral BID Lance Coon, MD   100 mg at 04/25/17 9417  . ondansetron (ZOFRAN) tablet 4 mg  4 mg Oral Q6H PRN  Lance Coon, MD       Or  . ondansetron [facility]) injection 4 mg  4 mg Intravenous Q6H PRN Lance Coon, MD      . piperacillin-tazobactam (ZOSYN) IVPB 3.375 g  3.375 g Intravenous Q8H Leonel Ramsay, MD 12.5 mL/hr at 04/25/17 1349 3.375 g at 04/25/17 1349  . simvastatin (ZOCOR) tablet 40 mg  40 mg Oral Corwin Levins, MD   40 mg at 04/24/17 2240     Discharge Medications: Please see discharge summary for a list of discharge medications.  Relevant Imaging Results:  Relevant Lab Results:   Additional Information ss: [id number]  Shela Leff, LCSW

## 2017-04-25 NOTE — Progress Notes (Signed)
[place] rounding unit visited with pt. Pt was sitting on the bed at the time of this visit. Pt stated he was waiting for his ride a rehab facility but the ride had not arrived. Pt appeared frustrated and distressed because he was told he won't go back to a facility where he was before coming to hospital. He wonder why they don't want him back. He further mentioned that he wanted to know how he is going to get his wheelchair and other personal belong that are still at the other facility.  Note: Chaplain observes that pt is confused because he does not know why he is being moved to another facility instead of the facility where he stayed before. If pt is going to a rehab facility for a short time, he would pleased to have that information communicated to him. Pt appears anxious but calm. [place] assured him that this problem will be resolved.

## 2017-04-25 NOTE — Evaluation (Signed)
Physical Therapy Evaluation Patient Details Name: Elijah Evans MRN: [id number] DOB: 1946/06/16 Today's Date: 04/25/2017   History of Present Illness  Pt is a 71 y/o M who presented with L upper quadrant abdominal pain, admitting diagnosis is sepsis.  Suspect source of sepsis to be GI related, blood cultures positive for E coli, Klebsiella, Enterobacter. Pt's PMH includes dementia, stroke, seizures, foot surgery.      Clinical Impression  Pt admitted with above diagnosis. Pt currently with functional limitations due to the deficits listed below (see PT Problem List). Elijah Evans was agreeable to therapy.  He currently requires mod +2 for bed mobility and sit<>stand and stand pivot transfers.  L knee buckle appreciated in [place] and pt dragging LLE when pivoting to his R.  Pt able to perform marching with LLE when instructed.  Pt will benefit from skilled PT to increase their independence and safety with mobility to allow discharge to the venue listed below.      Follow Up Recommendations SNF    Equipment Recommendations  None recommended by PT    Recommendations for Other Services       Precautions / Restrictions Precautions Precautions: Fall Precaution Comments: h/o CVA with L knee buckle Restrictions Weight Bearing Restrictions: No      Mobility  Bed Mobility Overal bed mobility: Needs Assistance Bed Mobility: Supine to Sit     Supine to sit: +2 for physical assistance;Mod assist;HOB elevated     General bed mobility comments: Pt uses bed rail and assist provided to elevate trunk and bed pad used to scoot pt to EOB.    Transfers Overall transfer level: Needs assistance Equipment used: 2 person hand held assist Transfers: Sit to/from [facility] Sit to Stand: +2 physical assistance;+2 safety/equipment;Mod assist Stand pivot transfers: +2 physical assistance;+2 safety/equipment;Mod assist       General transfer comment: Pt pushed into standing  with 2 person HHA and requires verbal and tactile cues to achieve upright as pt initially standing in flexed posture.  L knee buckle noted as pt takes side step at bedside.  Pt able to perform LLE march in standing but when pivoting pt drags L foot while pivoting on RLE.  Directional cues required to prevent pt from sitting prematurely.  Assist to control descent to chair.  Ambulation/Gait             General Gait Details: Unable to assess at this time  Stairs            Wheelchair Mobility    Modified Rankin (Stroke Patients Only)       Balance Overall balance assessment: Needs assistance;History of Falls Sitting-balance support: Feet supported;No upper extremity supported Sitting balance-Leahy Scale: Poor Sitting balance - Comments: Pt able to sit EOB with hands supported for ~4 minutes with min guard assist   Standing balance support: Bilateral upper extremity supported;During functional activity Standing balance-Leahy Scale: Poor Standing balance comment: Relies on outside physical assist to prevent LOB with static and dynamic activities                             Pertinent Vitals/Pain Pain Assessment: No/denies pain    Home Living Family/patient expects to be discharged to:: Skilled nursing facility                 Additional Comments: Per chart review, pt is from SNF    Prior Function Level of Independence: Needs assistance  Gait / Transfers Assistance Needed: Pt reports he does not ambulate at baseline but does perform stand pivot transfers.  He says he can do this transfer on his own but at times has to have help.  He reports he has had two falls in the past 6 months, both with stand pivot transfer.  He does not use an AD for this.  ADL's / Homemaking Assistance Needed: Pt has assist with shower and assist with dressing.  Meals are provided by facility.        Hand Dominance        Extremity/Trunk Assessment   Upper Extremity  Assessment Upper Extremity Assessment: LUE deficits/detail;RUE deficits/detail RUE Deficits / Details: Strength grossly 4-/5 LUE Deficits / Details: Strength grossly 3/5    Lower Extremity Assessment Lower Extremity Assessment: RLE deficits/detail;LLE deficits/detail RLE Deficits / Details: Strength grossly 3/5 LLE Deficits / Details: Strength grossly 2+/5    Cervical / Trunk Assessment Cervical / Trunk Assessment: Kyphotic  Communication   Communication: HOH (hears better on R side)  Cognition Arousal/Alertness: Awake/alert Behavior During Therapy: WFL for tasks assessed/performed Overall Cognitive Status: History of cognitive impairments - at baseline                                        General Comments General comments (skin integrity, edema, etc.): SpO2 94-96% throughout session on 1.5 L O2     Exercises General Exercises - Lower Extremity Ankle Circles/Pumps: AROM;Both;10 reps;Supine Hip ABduction/ADduction: AAROM;Both;5 reps;Supine Straight Leg Raises: AAROM;Both;5 reps;Supine   Assessment/Plan    PT Assessment Patient needs continued PT services  PT Problem List Decreased strength;Decreased activity tolerance;Decreased balance;Decreased mobility;Decreased cognition;Decreased knowledge of use of DME;Decreased safety awareness;Cardiopulmonary status limiting activity       PT Treatment Interventions DME instruction;Gait training;Functional mobility training;Therapeutic activities;Therapeutic exercise;Neuromuscular re-education;Balance training;Cognitive remediation;Patient/family education;Wheelchair mobility training    PT Goals (Current goals can be found in the Care Plan section)  Acute Rehab PT Goals Patient Stated Goal: to get stronger PT Goal Formulation: With patient Time For Goal Achievement: 05/09/17 Potential to Achieve Goals: Good    Frequency Min 2X/week   Barriers to discharge        Co-evaluation               AM-PAC  PT "6 Clicks" Daily Activity  Outcome Measure Difficulty turning over in bed (including adjusting bedclothes, sheets and blankets)?: Total Difficulty moving from lying on back to sitting on the side of the bed? : Total Difficulty sitting down on and standing up from a chair with arms (e.g., wheelchair, bedside commode, etc,.)?: Total Help needed moving to and from a bed to chair (including a wheelchair)?: A Lot Help needed walking in hospital room?: A Lot Help needed climbing 3-5 steps with a railing? : Total 6 Click Score: 8    End of Session Equipment Utilized During Treatment: Gait belt;Oxygen Activity Tolerance: Patient limited by fatigue Patient left: in chair;with call bell/phone within reach;with chair alarm set Nurse Communication: Mobility status;Other (comment) (SpO2) PT Visit Diagnosis: Unsteadiness on feet (R26.81);Muscle weakness (generalized) (M62.81);History of falling (Z91.81);Difficulty in walking, not elsewhere classified (R26.2)    Time: 5102-5852 PT Time Calculation (min) (ACUTE ONLY): 27 min   Charges:   PT Evaluation $PT Eval Moderate Complexity: 1 Procedure PT Treatments $Therapeutic Activity: 8-22 mins   PT G Codes:        Collie Siad  PT, DPT 04/25/2017, 1:41 PM

## 2017-04-28 DIAGNOSIS — R935 Abnormal findings on diagnostic imaging of other abdominal regions, including retroperitoneum: Secondary | ICD-10-CM

## 2017-05-31 ENCOUNTER — Other Ambulatory Visit: Payer: Self-pay

## 2017-05-31 ENCOUNTER — Encounter: Payer: Self-pay | Admitting: *Deleted

## 2017-05-31 ENCOUNTER — Ambulatory Visit: Payer: Medicare Other | Admitting: Gastroenterology

## 2017-06-30 ENCOUNTER — Inpatient Hospital Stay
Admission: EM | Admit: 2017-06-30 | Discharge: 2017-07-02 | DRG: 391 | Disposition: A | Discharge status: Skilled Nursing Facility | Payer: Medicare Other | Attending: Specialist | Admitting: Specialist

## 2017-06-30 ENCOUNTER — Encounter: Payer: Self-pay | Admitting: Emergency Medicine

## 2017-06-30 ENCOUNTER — Emergency Department: Payer: Medicare Other

## 2017-06-30 DIAGNOSIS — E785 Hyperlipidemia, unspecified: Secondary | ICD-10-CM | POA: Diagnosis present

## 2017-06-30 DIAGNOSIS — Z66 Do not resuscitate: Secondary | ICD-10-CM | POA: Diagnosis present

## 2017-06-30 DIAGNOSIS — R197 Diarrhea, unspecified: Secondary | ICD-10-CM

## 2017-06-30 DIAGNOSIS — K219 Gastro-esophageal reflux disease without esophagitis: Secondary | ICD-10-CM | POA: Diagnosis present

## 2017-06-30 DIAGNOSIS — Z79899 Other long term (current) drug therapy: Secondary | ICD-10-CM | POA: Diagnosis not present

## 2017-06-30 DIAGNOSIS — E876 Hypokalemia: Secondary | ICD-10-CM | POA: Diagnosis present

## 2017-06-30 DIAGNOSIS — I69354 Hemiplegia and hemiparesis following cerebral infarction affecting left non-dominant side: Secondary | ICD-10-CM

## 2017-06-30 DIAGNOSIS — F0390 Unspecified dementia without behavioral disturbance: Secondary | ICD-10-CM | POA: Diagnosis present

## 2017-06-30 DIAGNOSIS — I10 Essential (primary) hypertension: Secondary | ICD-10-CM | POA: Diagnosis present

## 2017-06-30 DIAGNOSIS — Z7982 Long term (current) use of aspirin: Secondary | ICD-10-CM

## 2017-06-30 DIAGNOSIS — F17210 Nicotine dependence, cigarettes, uncomplicated: Secondary | ICD-10-CM | POA: Diagnosis present

## 2017-06-30 DIAGNOSIS — G40909 Epilepsy, unspecified, not intractable, without status epilepticus: Secondary | ICD-10-CM | POA: Diagnosis present

## 2017-06-30 DIAGNOSIS — I951 Orthostatic hypotension: Secondary | ICD-10-CM

## 2017-06-30 DIAGNOSIS — R569 Unspecified convulsions: Secondary | ICD-10-CM

## 2017-06-30 DIAGNOSIS — A084 Viral intestinal infection, unspecified: Secondary | ICD-10-CM | POA: Diagnosis present

## 2017-06-30 DIAGNOSIS — R571 Hypovolemic shock: Secondary | ICD-10-CM | POA: Diagnosis present

## 2017-06-30 DIAGNOSIS — R0602 Shortness of breath: Secondary | ICD-10-CM

## 2017-06-30 LAB — CBC WITH DIFFERENTIAL/PLATELET
BASOS ABS: 0 10*3/uL (ref 0–0.1)
BASOS PCT: 0 %
Eosinophils Absolute: 0.1 10*3/uL (ref 0–0.7)
Eosinophils Relative: 1 %
HEMATOCRIT: 33.9 % — AB (ref 40.0–52.0)
HEMOGLOBIN: 11.5 g/dL — AB (ref 13.0–18.0)
LYMPHS PCT: 5 %
Lymphs Abs: 0.4 10*3/uL — ABNORMAL LOW (ref 1.0–3.6)
MCH: 31.1 pg (ref 26.0–34.0)
MCHC: 33.8 g/dL (ref 32.0–36.0)
MCV: 91.9 fL (ref 80.0–100.0)
MONO ABS: 0.5 10*3/uL (ref 0.2–1.0)
Monocytes Relative: 7 %
NEUTROS ABS: 6.3 10*3/uL (ref 1.4–6.5)
NEUTROS PCT: 87 %
Platelets: 209 10*3/uL (ref 150–440)
RBC: 3.68 MIL/uL — AB (ref 4.40–5.90)
RDW: 15 % — ABNORMAL HIGH (ref 11.5–14.5)
WBC: 7.4 10*3/uL (ref 3.8–10.6)

## 2017-06-30 LAB — COMPREHENSIVE METABOLIC PANEL
ALBUMIN: 3.1 g/dL — AB (ref 3.5–5.0)
ALT: 11 U/L — ABNORMAL LOW (ref 17–63)
ANION GAP: 8 (ref 5–15)
AST: 16 U/L (ref 15–41)
Alkaline Phosphatase: 108 U/L (ref 38–126)
BILIRUBIN TOTAL: 0.3 mg/dL (ref 0.3–1.2)
BUN: 14 mg/dL (ref 6–20)
CHLORIDE: 100 mmol/L — AB (ref 101–111)
CO2: 29 mmol/L (ref 22–32)
Calcium: 8.4 mg/dL — ABNORMAL LOW (ref 8.9–10.3)
Creatinine, Ser: 0.83 mg/dL (ref 0.61–1.24)
GFR calc Af Amer: >60 mL/min (ref >60)
Glucose, Bld: 129 mg/dL — ABNORMAL HIGH (ref 65–99)
POTASSIUM: 3.8 mmol/L (ref 3.5–5.1)
Sodium: 137 mmol/L (ref 135–145)
TOTAL PROTEIN: 5.8 g/dL — AB (ref 6.5–8.1)

## 2017-06-30 LAB — GASTROINTESTINAL PANEL BY PCR, STOOL (REPLACES STOOL CULTURE)

## 2017-06-30 LAB — C DIFFICILE QUICK SCREEN W PCR REFLEX
C DIFFICILE (CDIFF) INTERP: NOT DETECTED
C DIFFICILE (CDIFF) TOXIN: NEGATIVE
C DIFFICLE (CDIFF) ANTIGEN: NEGATIVE

## 2017-06-30 LAB — CARBAMAZEPINE LEVEL, TOTAL: CARBAMAZEPINE LVL: 5.6 ug/mL (ref 4.0–12.0)

## 2017-06-30 LAB — LACTIC ACID, PLASMA: LACTIC ACID, VENOUS: 1.6 mmol/L (ref 0.5–1.9)

## 2017-06-30 LAB — PROTIME-INR
INR: 0.98
Prothrombin Time: 13 seconds (ref 11.4–15.2)

## 2017-06-30 LAB — TROPONIN I

## 2017-06-30 LAB — MRSA PCR SCREENING: MRSA by PCR: NEGATIVE

## 2017-06-30 MED ORDER — ALBUTEROL SULFATE HFA 108 (90 BASE) MCG/ACT IN AERS
2.0000 | INHALATION_SPRAY | Freq: Four times a day (QID) | RESPIRATORY_TRACT | Status: DC | PRN
  Filled 2017-06-30: qty 6.7

## 2017-06-30 MED ORDER — LOSARTAN POTASSIUM 50 MG PO TABS
100.0000 mg | ORAL_TABLET | Freq: Every day | ORAL | Status: DC
  Filled 2017-06-30: qty 2

## 2017-06-30 MED ORDER — LOSARTAN POTASSIUM-HCTZ 100-12.5 MG PO TABS: 0.5000 | ORAL_TABLET | Freq: Every day | ORAL | Status: DC

## 2017-06-30 MED ORDER — HYDROCHLOROTHIAZIDE 12.5 MG PO CAPS
12.5000 mg | ORAL_CAPSULE | Freq: Every day | ORAL | Status: DC
  Filled 2017-06-30: qty 1

## 2017-06-30 MED ORDER — SODIUM CHLORIDE 0.9 % IV BOLUS (SEPSIS)
1000.0000 mL | Freq: Once | INTRAVENOUS | Status: AC
  Administered 2017-06-30: 1000 mL via INTRAVENOUS

## 2017-06-30 MED ORDER — VITAMIN B-12 1000 MCG PO TABS
1000.0000 ug | ORAL_TABLET | Freq: Every day | ORAL | Status: DC
  Administered 2017-07-01 – 2017-07-02 (×2): 1000 ug via ORAL
  Filled 2017-06-30 (×2): qty 1

## 2017-06-30 MED ORDER — ASPIRIN-DIPYRIDAMOLE ER 25-200 MG PO CP12
1.0000 | ORAL_CAPSULE | Freq: Two times a day (BID) | ORAL | Status: DC
  Administered 2017-06-30 – 2017-07-02 (×4): 1 via ORAL
  Filled 2017-06-30 (×5): qty 1

## 2017-06-30 MED ORDER — ACETAMINOPHEN 325 MG PO TABS: 650.0000 mg | ORAL_TABLET | ORAL | Status: DC | PRN

## 2017-06-30 MED ORDER — ONDANSETRON 4 MG PO TBDP
4.0000 mg | ORAL_TABLET | Freq: Three times a day (TID) | ORAL | Status: DC | PRN
  Filled 2017-06-30: qty 1

## 2017-06-30 MED ORDER — ONDANSETRON HCL 4 MG PO TABS: 4.0000 mg | ORAL_TABLET | Freq: Four times a day (QID) | ORAL | Status: DC | PRN

## 2017-06-30 MED ORDER — HEPARIN SODIUM (PORCINE) 5000 UNIT/ML IJ SOLN
5000.0000 [IU] | Freq: Three times a day (TID) | INTRAMUSCULAR | Status: DC
  Administered 2017-06-30 – 2017-07-02 (×5): 5000 [IU] via SUBCUTANEOUS
  Filled 2017-06-30 (×5): qty 1

## 2017-06-30 MED ORDER — ACETAMINOPHEN 650 MG RE SUPP: 650.0000 mg | Freq: Four times a day (QID) | RECTAL | Status: DC | PRN

## 2017-06-30 MED ORDER — ONDANSETRON HCL 4 MG/2ML IJ SOLN: 4.0000 mg | Freq: Four times a day (QID) | INTRAMUSCULAR | Status: DC | PRN

## 2017-06-30 MED ORDER — LACOSAMIDE 50 MG PO TABS
100.0000 mg | ORAL_TABLET | Freq: Two times a day (BID) | ORAL | Status: DC
  Administered 2017-06-30 – 2017-07-02 (×4): 100 mg via ORAL
  Filled 2017-06-30 (×4): qty 2

## 2017-06-30 MED ORDER — ORAL CARE MOUTH RINSE
15.0000 mL | Freq: Two times a day (BID) | OROMUCOSAL | Status: DC
  Administered 2017-07-01: 15 mL via OROMUCOSAL

## 2017-06-30 MED ORDER — SODIUM CHLORIDE 0.9 % IV BOLUS (SEPSIS)
1000.0000 mL | Freq: Once | INTRAVENOUS | Status: AC
Start: 2017-06-30 — End: 2017-06-30
  Administered 2017-06-30: 1000 mL via INTRAVENOUS

## 2017-06-30 MED ORDER — LACOSAMIDE 200 MG/20ML IV SOLN
100.0000 mg | Freq: Two times a day (BID) | INTRAVENOUS | Status: DC
Start: 2017-06-30 — End: 2017-06-30

## 2017-06-30 MED ORDER — SIMVASTATIN 20 MG PO TABS
40.0000 mg | ORAL_TABLET | Freq: Every day | ORAL | Status: DC
  Administered 2017-06-30 – 2017-07-01 (×2): 40 mg via ORAL
  Filled 2017-06-30 (×2): qty 2

## 2017-06-30 MED ORDER — SODIUM CHLORIDE 0.9 % IV SOLN
INTRAVENOUS | Status: DC
  Administered 2017-06-30 – 2017-07-02 (×4): via INTRAVENOUS

## 2017-06-30 MED ORDER — MAGNESIUM HYDROXIDE 400 MG/5ML PO SUSP: 30.0000 mL | Freq: Every day | ORAL | Status: DC | PRN

## 2017-06-30 MED ORDER — FLUTICASONE PROPIONATE 50 MCG/ACT NA SUSP
1.0000 | Freq: Every day | NASAL | Status: DC | PRN
  Filled 2017-06-30: qty 16

## 2017-06-30 MED ORDER — CARBAMAZEPINE 200 MG PO TABS
400.0000 mg | ORAL_TABLET | Freq: Two times a day (BID) | ORAL | Status: DC
  Administered 2017-06-30 – 2017-07-02 (×4): 400 mg via ORAL
  Filled 2017-06-30 (×5): qty 2

## 2017-06-30 MED ORDER — ADULT MULTIVITAMIN W/MINERALS CH
1.0000 | ORAL_TABLET | Freq: Every day | ORAL | Status: DC
  Administered 2017-07-01 – 2017-07-02 (×2): 1 via ORAL
  Filled 2017-06-30 (×2): qty 1

## 2017-06-30 MED ORDER — ACETAMINOPHEN 325 MG PO TABS: 650.0000 mg | ORAL_TABLET | Freq: Four times a day (QID) | ORAL | Status: DC | PRN

## 2017-06-30 NOTE — H&P (Signed)
Linda Biehn is an 71 y.o. male.   Chief Complaint: Less responsive HPI: This is 71 year old male who's a resident of [facility]. He tells me he began having several episodes of watery diarrhea today. No abdominal pain and nausea and vomiting. He needed help getting up and when the staff helped him up he said they put him back immediately to bed. He was reportedly he was unresponsive for a couple minutes and he possibly had a seizure. However no evidence of witnessed seizure could be found. On arrival is found to be hypertensive with blood pressures in the 60s. He says he feels weak. He became slightly hypoxic but this resolved.  Past Medical History:  Diagnosis Date  . Arthritis    osteo - pelvic area  . Dementia    early stages  . Hypertension   . Left leg weakness    S/P TIA  . Motion sickness    cars  . Seizures ([place])   . Shortness of breath dyspnea   . Stroke ([place])   . TIA (transient ischemic attack)    X3 - last one approx 2012  . Wears dentures    full upper and lower    Past Surgical History:  Procedure Laterality Date  . FOOT SURGERY    . TONSILLECTOMY      Family History  Problem Relation Age of Onset  . CAD Mother   . Stroke Mother   . CAD Father   . Stroke Father    Social History:  reports that he has been smoking Cigarettes.  He has a 100.00 pack-year smoking history. He has never used smokeless tobacco. He reports that he does not drink alcohol or use drugs.  Allergies: No Known Allergies   (Not in a hospital admission)  Results for orders placed or performed during the hospital encounter of 06/30/17 (from the past 48 hour(s))  Comprehensive metabolic panel     Status: Abnormal   Collection Time: 06/30/17  1:39 PM  Result Value Ref Range   Sodium 137 135 - 145 mmol/L   Potassium 3.8 3.5 - 5.1 mmol/L   Chloride 100 (L) 101 - 111 mmol/L   CO2 29 22 - 32 mmol/L   Glucose, Bld 129 (H) 65 - 99 mg/dL   BUN 14 6 - 20 mg/dL   Creatinine, Ser 0.83  0.61 - 1.24 mg/dL   Calcium 8.4 (L) 8.9 - 10.3 mg/dL   Total Protein 5.8 (L) 6.5 - 8.1 g/dL   Albumin 3.1 (L) 3.5 - 5.0 g/dL   AST 16 15 - 41 U/L   ALT 11 (L) 17 - 63 U/L   Alkaline Phosphatase 108 38 - 126 U/L   Total Bilirubin 0.3 0.3 - 1.2 mg/dL   GFR calc non Af Amer >60 >60 mL/min   GFR calc Af Amer >60 >60 mL/min    Comment: (NOTE) The eGFR has been calculated using the CKD EPI equation. This calculation has not been validated in all clinical situations. eGFR's persistently <60 mL/min signify possible Chronic Kidney Disease.    Anion gap 8 5 - 15  CBC with Differential     Status: Abnormal   Collection Time: 06/30/17  1:39 PM  Result Value Ref Range   WBC 7.4 3.8 - 10.6 K/uL   RBC 3.68 (L) 4.40 - 5.90 MIL/uL   Hemoglobin 11.5 (L) 13.0 - 18.0 g/dL   HCT 33.9 (L) 40.0 - 52.0 %   MCV 91.9 80.0 - 100.0 fL  MCH 31.1 26.0 - 34.0 pg   MCHC 33.8 32.0 - 36.0 g/dL   RDW 15.0 (H) 11.5 - 14.5 %   Platelets 209 150 - 440 K/uL   Neutrophils Relative % 87 %   Neutro Abs 6.3 1.4 - 6.5 K/uL   Lymphocytes Relative 5 %   Lymphs Abs 0.4 (L) 1.0 - 3.6 K/uL   Monocytes Relative 7 %   Monocytes Absolute 0.5 0.2 - 1.0 K/uL   Eosinophils Relative 1 %   Eosinophils Absolute 0.1 0 - 0.7 K/uL   Basophils Relative 0 %   Basophils Absolute 0.0 0 - 0.1 K/uL  Protime-INR     Status: None   Collection Time: 06/30/17  1:39 PM  Result Value Ref Range   Prothrombin Time 13.0 11.4 - 15.2 seconds   INR 0.98   Troponin I     Status: None   Collection Time: 06/30/17  1:39 PM  Result Value Ref Range   Troponin I <0.03 <0.03 ng/mL  Lactic acid, plasma     Status: None   Collection Time: 06/30/17  2:03 PM  Result Value Ref Range   Lactic Acid, Venous 1.6 0.5 - 1.9 mmol/L  C difficile quick scan w PCR reflex     Status: None   Collection Time: 06/30/17  2:03 PM  Result Value Ref Range   C Diff antigen NEGATIVE NEGATIVE   C Diff toxin NEGATIVE NEGATIVE   C Diff interpretation No C. difficile  detected.    Dg Chest Portable 1 View  Result Date: 06/30/2017 CLINICAL DATA:  Smoker, HTN status post seizure. EXAM: PORTABLE CHEST 1 VIEW COMPARISON:  Chest x-ray dated 04/21/2017. FINDINGS: Heart size and mediastinal contours are stable. Atherosclerotic changes noted at the aortic arch. Prominent interstitial lung markings again noted bilaterally, most prominent within the right infrahilar lung, suggesting chronic interstitial lung disease, stable. No new lung findings. No pleural effusion or pneumothorax seen. No acute or suspicious osseous finding. IMPRESSION: No active disease.  No evidence of pneumonia or pulmonary edema. Aortic atherosclerosis. 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 Electronically Signed   By: Franki Cabot M.D.   On: 06/30/2017 14:44    Review of Systems  Constitutional: Negative for fever.  HENT: Positive for hearing loss.   Eyes: Negative for blurred vision.  Respiratory: Negative for shortness of breath.   Cardiovascular: Negative for chest pain.  Gastrointestinal: Positive for diarrhea.  Genitourinary: Negative for dysuria.  Musculoskeletal: Positive for joint pain.  Skin: Negative for rash.  Neurological: Negative for dizziness.    Blood pressure 107/62, pulse 78, temperature 97.6 F (36.4 C), temperature source Oral, resp. rate 18, height 5' 9" (1.753 m), weight 81.6 kg (180 lb), SpO2 98 %. Physical Exam  Constitutional: He is oriented to person, place, and time. He appears well-developed and well-nourished. No distress.  HENT:  Head: Normocephalic and atraumatic.  Mouth/Throat: No oropharyngeal exudate.  Oral mucosa dry  Eyes: Pupils are equal, round, and reactive to light. No scleral icterus.  Neck: Neck supple. No thyromegaly present.  Cardiovascular: Normal rate and regular rhythm.   No murmur heard. Respiratory: Effort normal and breath sounds normal. No respiratory distress. He exhibits no tenderness.  GI: Soft. Bowel  sounds are normal. He exhibits no distension and no mass.  Musculoskeletal: He exhibits no edema.  Lymphadenopathy:    He has no cervical adenopathy.  Neurological: He is alert and oriented to person, place, and time. No cranial nerve deficit.  Skin: Skin  is warm and dry.     Assessment/Plan 1. Hypovolemic shock. Blood pressure was in the 60s on arrival. He's had a lot of diarrhea today so I suspect he's had fluid loss causing this. He is responding to fluid resuscitation. We'll continue aggressive IV hydration. 2. Diarrhea. Stool sample was ruled out for C. difficile. We'll order stool cultures. Continue supportive care for now. We'll give him antidiarrheal medications if infectious cause rules out. 3. Seizure disorder. He possibly had a seizure at the care home. He is on 2 seizure medications. He's had no witnessed seizures since he's been here. We'll go ahead and check his Tegretol level to make sure his therapeutic. 4. History of CVA. Continue current medications. 5. CODE STATUS. Discussed with his daughter is POA. She says he's been DO NOT RESUSCITATE for years even though the paperwork from the care home says full code checked. She said he's had an out of facility yellow DO NOT RESUSCITATE form for many years. And they wish him to remain DO NOT RESUSCITATE.  Total time spent 45 minutes  Baxter Hire, MD 06/30/2017, 6:47 PM

## 2017-06-30 NOTE — ED Provider Notes (Signed)
Assume care of the patient from Dr. Sullivan Lone at 3:00 PM awaiting results of C. difficile studies and repeat assessment of the patient's vital signs after hydration with 2 L of IV saline. After the fluids, he remained with orthostatic hypotension, significant drop in his blood pressure to 68/55 when sitting upright. C. difficile was negative. I added on a GI biofire panel. Patient care conveyed to hospitalist for further management.  Final diagnoses:  Seizure ([place])  Diarrhea of presumed infectious origin  Orthostatic hypotension      Carrie Mew, MD 06/30/17 [phone]

## 2017-06-30 NOTE — ED Provider Notes (Signed)
[facility] Emergency Department Provider Note  ____________________________________________  Time seen: Approximately 3:17 PM  I have reviewed the triage vital signs and the nursing notes.   HISTORY  Chief Complaint Seizures  Level 5 caveat:  Portions of the history and physical were unable to be obtained due to dementia   HPI Elijah Evans is a 71 y.o. male with a history of epilepsy, dementia,stroke, hypertension who presents for evaluation of seizure. Patient was found in his room at a skilled nursing facility having a seizure. According to patient he has been having diarrhea since earlier today. No prior h/o C. Diff. patient does not know how many episodes he has had today. He denies headache, chest pain, shortness of breath, nausea, vomiting, abdominal pain, dysuria or fever.  Past Medical History:  Diagnosis Date  . Arthritis    osteo - pelvic area  . Dementia    early stages  . Hypertension   . Left leg weakness    S/P TIA  . Motion sickness    cars  . Seizures ([place])   . Shortness of breath dyspnea   . Stroke ([place])   . TIA (transient ischemic attack)    X3 - last one approx 2012  . Wears dentures    full upper and lower    Patient Active Problem List   Diagnosis Date Noted  . Abnormal CT of the abdomen 04/28/2017  . Abdominal pain 04/21/2017  . Seizures ([place]) 04/21/2017  . Hypertension 04/21/2017  . Sepsis ([place]) 04/21/2017  . Dilantin toxicity 03/11/2017  . SBO (small bowel obstruction) ([place])   . Small bowel obstruction ([place]) 12/08/2015  . Hypercholesterolemia 11/12/2014  . Seizure disorder ([place]) 11/12/2014    Past Surgical History:  Procedure Laterality Date  . FOOT SURGERY    . TONSILLECTOMY      Prior to Admission medications   Medication Sig Start Date End Date Taking? Authorizing Provider  acetaminophen (TYLENOL) 325 MG tablet Take 2 tablets (650 mg total) by mouth every 4 (four) hours as needed for mild  pain, moderate pain or headache. 12/12/15   Loflin, Lavona Mound, MD  albuterol (PROVENTIL HFA;VENTOLIN HFA) 108 (90 Base) MCG/ACT inhaler Inhale 2 puffs into the lungs every 6 (six) hours as needed for wheezing or shortness of breath. 03/09/17   Merlyn Lot, MD  carbamazepine (TEGRETOL) 200 MG tablet Take 400 mg by mouth 2 (two) times daily.    [provider]  dipyridamole-aspirin (AGGRENOX) 200-25 MG 12hr capsule Take 1 capsule by mouth 2 (two) times daily.    [provider]  fluticasone (FLONASE) 50 MCG/ACT nasal spray Place into both nostrils daily.    [provider]  lacosamide 100 MG TABS Take 1 tablet (100 mg total) by mouth 2 (two) times daily. 03/13/17   Hillary Bow, MD  losartan-hydrochlorothiazide (HYZAAR) 100-12.5 MG tablet Take 0.5 tablets by mouth daily.     [provider]  magnesium hydroxide (MILK OF MAGNESIA) 400 MG/5ML suspension Take 30 mLs by mouth daily as needed for moderate constipation.    [provider]  meclizine (ANTIVERT) 32 MG tablet Take 1 tablet (32 mg total) by mouth 3 (three) times daily as needed. 03/06/17   Darel Hong, MD  ondansetron (ZOFRAN ODT) 4 MG disintegrating tablet Take 1 tablet (4 mg total) by mouth every 8 (eight) hours as needed for nausea or vomiting. 03/06/17   Darel Hong, MD  phenytoin (DILANTIN) 100 MG ER capsule Take by mouth. 11/10/15  [provider]  phenytoin (DILANTIN) 30 MG ER capsule Take by mouth. 11/10/15   [provider]  simvastatin (ZOCOR) 40 MG tablet Take 40 mg by mouth at bedtime.    [provider]  vitamin B-12 (CYANOCOBALAMIN) 1000 MCG tablet Take 1,000 mcg by mouth daily.    [provider]    Allergies Patient has no known allergies.  Family History  Problem Relation Age of Onset  . CAD Mother   . Stroke Mother   . CAD Father   . Stroke Father     Social History Social History  Substance Use Topics  . Smoking status:  Current Every Day Smoker    Packs/day: 2.00    Years: 50.00    Types: Cigarettes  . Smokeless tobacco: Never Used     Comment: In care facility as of 12/11/15 - smokes only 4-5 cigs/day there  . Alcohol use No     Comment: Used to drink a lot but quit 5 years ago.    Review of Systems  Constitutional: Negative for fever. Eyes: Negative for visual changes. ENT: Negative for sore throat. Neck: No neck pain  Cardiovascular: Negative for chest pain. Respiratory: Negative for shortness of breath. Gastrointestinal: Negative for abdominal pain, vomiting. + diarrhea. Genitourinary: Negative for dysuria. Musculoskeletal: Negative for back pain. Skin: Negative for rash. Neurological: Negative for headaches, weakness or numbness. + seizure Psych: No SI or HI  ____________________________________________   PHYSICAL EXAM:  VITAL SIGNS: ED Triage Vitals  Enc Vitals Group     BP 06/30/17 1322 (!) 87/53     Pulse Rate 06/30/17 1330 62     Resp 06/30/17 1330 (!) 21     Temp 06/30/17 1431 97.6 F (36.4 C)     Temp Source 06/30/17 1431 Oral     SpO2 06/30/17 1330 95 %     Weight 06/30/17 1431 180 lb (81.6 kg)     Height 06/30/17 1431 5\' 9"  (1.753 m)     Head Circumference --      Peak Flow --      Pain Score --      Pain Loc --      Pain Edu? --      Excl. in [place]? --     Constitutional: Alert and oriented. Well appearing and in no apparent distress. HEENT:      Head: Normocephalic and atraumatic.         Eyes: Conjunctivae are normal. Sclera is non-icteric.       Mouth/Throat: Mucous membranes are moist.       Neck: Supple with no signs of meningismus. Cardiovascular: Regular rate and rhythm. No murmurs, gallops, or rubs. 2+ symmetrical distal pulses are present in all extremities. No JVD. Respiratory: Normal respiratory effort. Lungs are clear to auscultation bilaterally. No wheezes, crackles, or rhonchi.  Gastrointestinal: Soft, non tender, and non distended with positive bowel  sounds. No rebound or guarding. Foul smelling watery stool, no blood. Musculoskeletal: Nontender with normal range of motion in all extremities. No edema, cyanosis, or erythema of extremities. Neurologic: Normal speech and language. Face is symmetric. Moving all extremities. No gross focal neurologic deficits are appreciated. Skin: Skin is warm, dry and intact. No rash noted. Psychiatric: Mood and affect are normal. Speech and behavior are normal.  ____________________________________________   LABS (all labs ordered are listed, but only abnormal results are displayed)  Labs Reviewed  COMPREHENSIVE METABOLIC PANEL - Abnormal; Notable for the following:  Result Value   Chloride 100 (*)    Glucose, Bld 129 (*)    Calcium 8.4 (*)    Total Protein 5.8 (*)    Albumin 3.1 (*)    ALT 11 (*)    All other components within normal limits  CBC WITH DIFFERENTIAL/PLATELET - Abnormal; Notable for the following:    RBC 3.68 (*)    Hemoglobin 11.5 (*)    HCT 33.9 (*)    RDW 15.0 (*)    Lymphs Abs 0.4 (*)    All other components within normal limits  CULTURE, BLOOD (ROUTINE X 2)  CULTURE, BLOOD (ROUTINE X 2)  C DIFFICILE QUICK SCREEN W PCR REFLEX  LACTIC ACID, PLASMA  PROTIME-INR  TROPONIN I  LACTIC ACID, PLASMA  URINALYSIS, COMPLETE (UACMP) WITH MICROSCOPIC   ____________________________________________  EKG  ED ECG REPORT I, Rudene Re, the attending physician, personally viewed and interpreted this ECG.  Normal sinus rhythm, rate of 66, right bundle branch block, normal QTC, normal axis, no ST elevations or depressions. No significant changes when compared to prior from June 2018 ____________________________________________  RADIOLOGY  CXR: No active disease. No evidence of pneumonia or pulmonary edema. ____________________________________________   PROCEDURES  Procedure(s) performed: None Procedures Critical Care performed:   None ____________________________________________   INITIAL IMPRESSION / ASSESSMENT AND PLAN / ED COURSE  70 y.o. male with a history of epilepsy, dementia,stroke, hypertension who presents for evaluation of seizure in the setting of diarrhea. Patient arrives alert and oriented 2 which is his baseline, afebrile, hypotensive and initially hypoxic however that has resolved.Hypoxia maybe due to being post-ictal from seizure. Patient is now satting 100% on RA. Watery foul smelling stool concerning for C. Diff which was sent to lab. Labs with no acute findings, normal lactic, normal creatinine, normal white count, stable hemoglobin. Patient is grossly neurologically intact. Blood pressure improved with 1 L of normal saline. We'll give a second liter fluids at this time. We'll continue to monitor until C. difficile is back. Care transferred to Dr. Joni Fears     Pertinent labs & imaging results that were available during my care of the patient were reviewed by me and considered in my medical decision making (see chart for details).    ____________________________________________   FINAL CLINICAL IMPRESSION(S) / ED DIAGNOSES  Final diagnoses:  Seizure ([place])  Diarrhea of presumed infectious origin      NEW MEDICATIONS STARTED DURING THIS VISIT:  New Prescriptions   No medications on file     Note:  This document was prepared using Dragon voice recognition software and may include unintentional dictation errors.    Alfred Levins, [place], MD 06/30/17 [phone]

## 2017-06-30 NOTE — Progress Notes (Signed)
LCSW consulted with EDP and patient is likely to DC back to [facility]. No further needs at this time  [employer] LCSW [phone]

## 2017-06-30 NOTE — ED Triage Notes (Signed)
Patient from [facility] via [facility]. EMS reports that Staff "found the patient at the end of a seizure".   Patient arrives non-verbal, hypotensive, and hypoxic.   Brief phone call to the facility reveals that patient is A+Ox4,talkative, hard of hearing, on room air without supplementary O2, and 1 person assist AT BASELINE

## 2017-06-30 NOTE — ED Notes (Signed)
Pt transport to 151

## 2017-06-30 NOTE — ED Notes (Addendum)
Pt was unable to complete orthostatic BP task due to extreme dizziness. Dr Joni Fears advised.

## 2017-07-01 LAB — BASIC METABOLIC PANEL
ANION GAP: 6 (ref 5–15)
BUN: 15 mg/dL (ref 6–20)
CHLORIDE: 106 mmol/L (ref 101–111)
CO2: 27 mmol/L (ref 22–32)
Calcium: 8.1 mg/dL — ABNORMAL LOW (ref 8.9–10.3)
Creatinine, Ser: 0.72 mg/dL (ref 0.61–1.24)
GFR calc Af Amer: >60 mL/min (ref >60)
GFR calc non Af Amer: >60 mL/min (ref >60)
GLUCOSE: 106 mg/dL — AB (ref 65–99)
POTASSIUM: 3.3 mmol/L — AB (ref 3.5–5.1)
Sodium: 139 mmol/L (ref 135–145)

## 2017-07-01 LAB — URINALYSIS, COMPLETE (UACMP) WITH MICROSCOPIC
BILIRUBIN URINE: NEGATIVE
Bacteria, UA: NONE SEEN
Glucose, UA: NEGATIVE mg/dL
Hgb urine dipstick: NEGATIVE
Ketones, ur: NEGATIVE mg/dL
Leukocytes, UA: NEGATIVE
NITRITE: NEGATIVE
PH: 5 (ref 5.0–8.0)
Protein, ur: NEGATIVE mg/dL
RBC / HPF: NONE SEEN RBC/hpf (ref 0–5)
SPECIFIC GRAVITY, URINE: 1.006 (ref 1.005–1.030)
WBC, UA: NONE SEEN WBC/hpf (ref 0–5)

## 2017-07-01 LAB — OCCULT BLOOD X 1 CARD TO LAB, STOOL: FECAL OCCULT BLD: POSITIVE — AB

## 2017-07-01 LAB — CBC
HEMATOCRIT: 30.8 % — AB (ref 40.0–52.0)
HEMOGLOBIN: 10.5 g/dL — AB (ref 13.0–18.0)
MCH: 31.2 pg (ref 26.0–34.0)
MCHC: 34.3 g/dL (ref 32.0–36.0)
MCV: 91.1 fL (ref 80.0–100.0)
Platelets: 199 10*3/uL (ref 150–440)
RBC: 3.38 MIL/uL — AB (ref 4.40–5.90)
RDW: 14.8 % — ABNORMAL HIGH (ref 11.5–14.5)
WBC: 5.8 10*3/uL (ref 3.8–10.6)

## 2017-07-01 MED ORDER — POTASSIUM CHLORIDE CRYS ER 20 MEQ PO TBCR
40.0000 meq | EXTENDED_RELEASE_TABLET | Freq: Once | ORAL | Status: AC
  Administered 2017-07-01: 40 meq via ORAL
  Filled 2017-07-01: qty 2

## 2017-07-01 MED ORDER — PANTOPRAZOLE SODIUM 40 MG IV SOLR
40.0000 mg | Freq: Two times a day (BID) | INTRAVENOUS | Status: DC
  Administered 2017-07-01 – 2017-07-02 (×4): 40 mg via INTRAVENOUS
  Filled 2017-07-01 (×4): qty 40

## 2017-07-01 NOTE — Progress Notes (Signed)
[facility] at [place] NAME: Elijah Evans    MR#:  [id number]  DATE OF BIRTH:  [birth year]  SUBJECTIVE:   Patient here due to hypovolemic shock secondary to ongoing diarrhea. No longer having diarrhea this morning. Hemodynamics have improved. Stool culture was negative for C. difficile and other pathogens. We'll DC enteric precautions.  REVIEW OF SYSTEMS:    Review of Systems  Constitutional: Negative for chills and fever.  HENT: Negative for congestion and tinnitus.   Eyes: Negative for blurred vision and double vision.  Respiratory: Negative for cough, shortness of breath and wheezing.   Cardiovascular: Negative for chest pain, orthopnea and PND.  Gastrointestinal: Negative for abdominal pain, diarrhea, nausea and vomiting.  Genitourinary: Negative for dysuria and hematuria.  Neurological: Negative for dizziness, sensory change and focal weakness.  All other systems reviewed and are negative.   Nutrition: Regular Tolerating Diet: Yes Tolerating PT: Await Eval.   DRUG ALLERGIES:  No Known Allergies  VITALS:  Blood pressure (!) 105/51, pulse 81, temperature 97.8 F (36.6 C), temperature source Oral, resp. rate (!) 22, height 5\' 9"  (1.753 m), weight 82.4 kg (181 lb 9.6 oz), SpO2 98 %.  PHYSICAL EXAMINATION:   Physical Exam  GENERAL:  71 y.o.-year-old patient lying in bed in no acute distress.  EYES: Pupils equal, round, reactive to light and accommodation. No scleral icterus. Extraocular muscles intact.  HEENT: Head atraumatic, normocephalic. Oropharynx and nasopharynx clear.  NECK:  Supple, no jugular venous distention. No thyroid enlargement, no tenderness.  LUNGS: Normal breath sounds bilaterally, no wheezing, rales, rhonchi. No use of accessory muscles of respiration.  CARDIOVASCULAR: S1, S2 normal. No murmurs, rubs, or gallops.  ABDOMEN: Soft, nontender, nondistended. Bowel sounds present. No organomegaly or mass.   EXTREMITIES: No cyanosis, clubbing or edema b/l.    NEUROLOGIC: Cranial nerves II through XII are intact. No focal Motor or sensory deficits b/l.   PSYCHIATRIC: The patient is alert and oriented x 2.  SKIN: No obvious rash, lesion, or ulcer.    LABORATORY PANEL:   CBC  Recent Labs Lab 07/01/17 0330  WBC 5.8  HGB 10.5*  HCT 30.8*  PLT 199   ------------------------------------------------------------------------------------------------------------------  Chemistries   Recent Labs Lab 06/30/17 1339 07/01/17 0330  NA 137 139  K 3.8 3.3*  CL 100* 106  CO2 29 27  GLUCOSE 129* 106*  BUN 14 15  CREATININE 0.83 0.72  CALCIUM 8.4* 8.1*  AST 16  --   ALT 11*  --   ALKPHOS 108  --   BILITOT 0.3  --    ------------------------------------------------------------------------------------------------------------------  Cardiac Enzymes  Recent Labs Lab 06/30/17 1339  TROPONINI <0.03   ------------------------------------------------------------------------------------------------------------------  RADIOLOGY:  Dg Chest Portable 1 View  Result Date: 06/30/2017 CLINICAL DATA:  Smoker, HTN status post seizure. EXAM: PORTABLE CHEST 1 VIEW COMPARISON:  Chest x-ray dated 04/21/2017. FINDINGS: Heart size and mediastinal contours are stable. Atherosclerotic changes noted at the aortic arch. Prominent interstitial lung markings again noted bilaterally, most prominent within the right infrahilar lung, suggesting chronic interstitial lung disease, stable. No new lung findings. No pleural effusion or pneumothorax seen. No acute or suspicious osseous finding. IMPRESSION: No active disease.  No evidence of pneumonia or pulmonary edema. Aortic atherosclerosis. 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 Electronically Signed   By: Franki Cabot M.D.   On: 06/30/2017 14:44     ASSESSMENT AND PLAN:   71 year old male with past medical history of previous TIA,  hypertension, dementia, osteoarthritis, previous history of seizures who presented to the hospital due to hypovolemic shock and diarrhea.  1. Hypovolemic shock-this was secondary to his ongoing diarrhea. Patient has been adequately hydrated and his hemodynamics have improved.  2. Diarrhea-acute in nature and likely viral. Patient's stool for C. difficile and gastrointestinal panel is currently negative. - This has improved, continue Imodium as needed.  3. Hypokalemia-secondary to the diarrhea. We'll replace and repeat level in the morning.  4. History of seizures-no acute seizures presently. -Continue Vimpat, Tegretol. Patient's Tegretol level is within range.  5. History of previous TIA-continue Aggrenox.  6. GERD-continue Protonix.  7. Hyperlipidemia-continue simvastatin.  8. Essential hypertension-antihypertensives on hold given his hypovolemia and shock on presentation.     All the records are reviewed and case discussed with Care Management/Social Worker. Management plans discussed with the patient, family and they are in agreement.  CODE STATUS: DNR  DVT Prophylaxis: Hep. SQ  TOTAL TIME TAKING CARE OF THIS PATIENT: 30 minutes.   POSSIBLE D/C IN 1-2 DAYS, DEPENDING ON CLINICAL CONDITION.   Henreitta Leber M.D on 07/01/2017 at 2:10 PM  Between 7am to 6pm - Pager - [phone]  After 6pm go to [email or web address] - [employer] [facility]  Office  [phone]  CC: Primary care physician; [facility]

## 2017-07-01 NOTE — Progress Notes (Signed)
Pt in no acute distress at this time. VSS. Fecal occult blood positive. Hgb stable at this time.

## 2017-07-01 NOTE — Progress Notes (Signed)
LCSW  Reviewed patient information in ED patient resides at [facility]. LCSW will notify weekend LCSW  Lanya Bucks [phone]

## 2017-07-01 NOTE — Progress Notes (Signed)
Pt has hard round nodule on left anterior neck. Pt states "That's been there."

## 2017-07-01 NOTE — Clinical Social Work Note (Addendum)
CSW received consult that this patient needs placement. According to the ED CSW, the patient has placement at [facility].   CSW has spoken to the patient who confirmed that he is from [place] and would like to return. CSW will contact the patient's daughter when able to complete a full assessment. CSW will follow.  [name], MSW, Latanya Presser [phone]

## 2017-07-02 ENCOUNTER — Inpatient Hospital Stay: Payer: Medicare Other

## 2017-07-02 LAB — POTASSIUM: POTASSIUM: 3.5 mmol/L (ref 3.5–5.1)

## 2017-07-02 MED ORDER — HYDROCHLOROTHIAZIDE 10 MG/ML ORAL SUSPENSION
6.2500 mg | Freq: Every day | ORAL | Status: DC
  Administered 2017-07-02: 6.25 mg via ORAL
  Filled 2017-07-02: qty 1.25

## 2017-07-02 MED ORDER — LOSARTAN POTASSIUM 50 MG PO TABS
50.0000 mg | ORAL_TABLET | Freq: Every day | ORAL | Status: DC
  Administered 2017-07-02: 50 mg via ORAL
  Filled 2017-07-02: qty 1

## 2017-07-02 MED ORDER — LOSARTAN POTASSIUM-HCTZ 100-12.5 MG PO TABS: 0.5000 | ORAL_TABLET | Freq: Every day | ORAL | Status: DC

## 2017-07-02 NOTE — Progress Notes (Signed)
EMS here for transport

## 2017-07-02 NOTE — Discharge Summary (Signed)
[facility] at [place] NAME: Elijah Evans    MR#:  [id number]  DATE OF BIRTH:  September 18, 1946  DATE OF ADMISSION:  06/30/2017 ADMITTING PHYSICIAN: Baxter Hire, MD  DATE OF DISCHARGE: 07/02/2017  PRIMARY CARE PHYSICIAN: [place]    ADMISSION DIAGNOSIS:  Diarrhea of presumed infectious origin [R19.7] Orthostatic hypotension [I95.1] Seizure ([place]) [R56.9]  DISCHARGE DIAGNOSIS:  Active Problems:   Hypovolemic shock ([place])   SECONDARY DIAGNOSIS:   Past Medical History:  Diagnosis Date  . Arthritis    osteo - pelvic area  . Dementia    early stages  . Hypertension   . Left leg weakness    S/P TIA  . Motion sickness    cars  . Seizures ([place])   . Shortness of breath dyspnea   . Stroke ([place])   . TIA (transient ischemic attack)    X3 - last one approx 2012  . Wears dentures    full upper and lower    HOSPITAL COURSE:   71 year old male with past medical history of previous TIA, hypertension, dementia, osteoarthritis, previous history of seizures who presented to the hospital due to hypovolemic shock and diarrhea.  1. Hypovolemic shock-this was secondary to his ongoing diarrhea.  -Patient was aggressively hydrated with IV fluids and his hemodynamics have significantly improved and stable presently.   2. Diarrhea-acute in nature and likely viral. Patient's stool for C. difficile and gastrointestinal panel is currently negative. -His diarrhea has presently resolved. He can continue as needed Imodium if his diarrhea worsens.  3. Hypokalemia-secondary to the diarrhea. This has improved and resolved with supplementation.  4. History of seizures-no acute seizures while in the hospital - pt. Will Continue Vimpat, Tegretol. Patient's Tegretol level was within range.  5. History of previous TIA- he will continue Aggrenox.  6. GERD- he will continue Protonix.  7. Hyperlipidemia- he will continue  simvastatin.  8. Essential hypertension- patient's antihypertensives were held due to hypotension admission. Now they can be resumed upon discharge as his hemodynamics have improved. Patient will resume his losartan/HCTZ.  DISCHARGE CONDITIONS:   Stable  CONSULTS OBTAINED:  Treatment Team:  Wilford Corner, MD  DRUG ALLERGIES:  No Known Allergies  DISCHARGE MEDICATIONS:   Allergies as of 07/02/2017   No Known Allergies     Medication List    TAKE these medications   acetaminophen 325 MG tablet Commonly known as:  TYLENOL Take 2 tablets (650 mg total) by mouth every 4 (four) hours as needed for mild pain, moderate pain or headache.   albuterol 108 (90 Base) MCG/ACT inhaler Commonly known as:  PROVENTIL HFA;VENTOLIN HFA Inhale 2 puffs into the lungs every 6 (six) hours as needed for wheezing or shortness of breath.   carbamazepine 200 MG tablet Commonly known as:  TEGRETOL Take 400 mg by mouth 2 (two) times daily.   dipyridamole-aspirin 200-25 MG 12hr capsule Commonly known as:  AGGRENOX Take 1 capsule by mouth 2 (two) times daily.   fluticasone 50 MCG/ACT nasal spray Commonly known as:  FLONASE Place 1 spray into both nostrils daily as needed for allergies or rhinitis.   Lacosamide 100 MG Tabs Take 1 tablet (100 mg total) by mouth 2 (two) times daily.   losartan-hydrochlorothiazide 100-12.5 MG tablet Commonly known as:  HYZAAR Take 0.5 tablets by mouth daily.   magnesium hydroxide 400 MG/5ML suspension Commonly known as:  MILK OF MAGNESIA Take 30 mLs by mouth daily as needed for moderate  constipation.   meclizine 32 MG tablet Commonly known as:  ANTIVERT Take 1 tablet (32 mg total) by mouth 3 (three) times daily as needed.   multivitamin with minerals tablet Take 1 tablet by mouth daily.   ondansetron 4 MG disintegrating tablet Commonly known as:  ZOFRAN ODT Take 1 tablet (4 mg total) by mouth every 8 (eight) hours as needed for nausea or vomiting.    simvastatin 40 MG tablet Commonly known as:  ZOCOR Take 40 mg by mouth at bedtime.   vitamin B-12 1000 MCG tablet Commonly known as:  CYANOCOBALAMIN Take 1,000 mcg by mouth daily.            Discharge Care Instructions        Start     Ordered   07/02/17 0000  Activity as tolerated - No restrictions     07/02/17 1103   07/02/17 0000  Diet - low sodium heart healthy     07/02/17 1103        DISCHARGE INSTRUCTIONS:   DIET:  Cardiac diet  DISCHARGE CONDITION:  Stable  ACTIVITY:  Activity as tolerated  OXYGEN:  Home Oxygen: No.   Oxygen Delivery: room air  DISCHARGE LOCATION:  nursing home   If you experience worsening of your admission symptoms, develop shortness of breath, life threatening emergency, suicidal or homicidal thoughts you must seek medical attention immediately by calling 911 or calling your MD immediately  if symptoms less severe.  You Must read complete instructions/literature along with all the possible adverse reactions/side effects for all the Medicines you take and that have been prescribed to you. Take any new Medicines after you have completely understood and accpet all the possible adverse reactions/side effects.   Please note  You were cared for by a hospitalist during your hospital stay. If you have any questions about your discharge medications or the care you received while you were in the hospital after you are discharged, you can call the unit and asked to speak with the hospitalist on call if the hospitalist that took care of you is not available. Once you are discharged, your primary care physician will handle any further medical issues. Please note that NO REFILLS for any discharge medications will be authorized once you are discharged, as it is imperative that you return to your primary care physician (or establish a relationship with a primary care physician if you do not have one) for your aftercare needs so that they can reassess  your need for medications and monitor your lab values.     Today   No further diarrhea overnight. No nausea, vomiting. Tolerating by mouth well. Has a cough which is nonproductive. Chest x-ray yesterday and today showing no evidence of acute pneumonia.  VITAL SIGNS:  Blood pressure (!) 154/68, pulse 66, temperature 97.7 F (36.5 C), temperature source Oral, resp. rate 18, height 5\' 9"  (1.753 m), weight 82.4 kg (181 lb 9.6 oz), SpO2 97 %.  I/O:    Intake/Output Summary (Last 24 hours) at 07/02/17 1103 Last data filed at 07/02/17 0800  Gross per 24 hour  Intake          1632.92 ml  Output                0 ml  Net          1632.92 ml    PHYSICAL EXAMINATION:   GENERAL:  71 y.o.-year-old patient lying in bed in no acute distress.  EYES: Pupils equal, round,  reactive to light and accommodation. No scleral icterus. Extraocular muscles intact.  HEENT: Head atraumatic, normocephalic. Oropharynx and nasopharynx clear.  NECK:  Supple, no jugular venous distention. No thyroid enlargement, no tenderness.  LUNGS: Normal breath sounds bilaterally, no wheezing, rales, rhonchi. No use of accessory muscles of respiration.  + upper airway rattling CARDIOVASCULAR: S1, S2 normal. No murmurs, rubs, or gallops.  ABDOMEN: Soft, nontender, nondistended. Bowel sounds present. No organomegaly or mass.  EXTREMITIES: No cyanosis, clubbing or edema b/l.    NEUROLOGIC: Cranial nerves II through XII are intact. No focal Motor or sensory deficits b/l.   PSYCHIATRIC: The patient is alert and oriented x 3.  SKIN: No obvious rash, lesion, or ulcer.   DATA REVIEW:   CBC  Recent Labs Lab 07/01/17 0330  WBC 5.8  HGB 10.5*  HCT 30.8*  PLT 199    Chemistries   Recent Labs Lab 06/30/17 1339 07/01/17 0330 07/02/17 0424  NA 137 139  --   K 3.8 3.3* 3.5  CL 100* 106  --   CO2 29 27  --   GLUCOSE 129* 106*  --   BUN 14 15  --   CREATININE 0.83 0.72  --   CALCIUM 8.4* 8.1*  --   AST 16  --   --    ALT 11*  --   --   ALKPHOS 108  --   --   BILITOT 0.3  --   --     Cardiac Enzymes  Recent Labs Lab 06/30/17 1339  TROPONINI <0.03    Microbiology Results  Results for orders placed or performed during the hospital encounter of 06/30/17  Culture, blood (Routine x 2)     Status: None (Preliminary result)   Collection Time: 06/30/17  1:55 PM  Result Value Ref Range Status   Specimen Description BLOOD RIGHT ANTECUBITAL  Final   Special Requests   Final    BOTTLES DRAWN AEROBIC AND ANAEROBIC Blood Culture adequate volume   Culture NO GROWTH < 24 HOURS  Final   Report Status PENDING  Incomplete  Culture, blood (Routine x 2)     Status: None (Preliminary result)   Collection Time: 06/30/17  2:03 PM  Result Value Ref Range Status   Specimen Description BLOOD LEFT ANTECUBITAL  Final   Special Requests   Final    BOTTLES DRAWN AEROBIC AND ANAEROBIC Blood Culture adequate volume   Culture NO GROWTH < 24 HOURS  Final   Report Status PENDING  Incomplete  C difficile quick scan w PCR reflex     Status: None   Collection Time: 06/30/17  2:03 PM  Result Value Ref Range Status   C Diff antigen NEGATIVE NEGATIVE Final   C Diff toxin NEGATIVE NEGATIVE Final   C Diff interpretation No C. difficile detected.  Final  Gastrointestinal Panel by PCR , Stool     Status: None   Collection Time: 06/30/17  2:03 PM  Result Value Ref Range Status   Campylobacter species NOT DETECTED NOT DETECTED Final   Plesimonas shigelloides NOT DETECTED NOT DETECTED Final   Salmonella species NOT DETECTED NOT DETECTED Final   Yersinia enterocolitica NOT DETECTED NOT DETECTED Final   Vibrio species NOT DETECTED NOT DETECTED Final   Vibrio cholerae NOT DETECTED NOT DETECTED Final   Enteroaggregative E coli (EAEC) NOT DETECTED NOT DETECTED Final   Enteropathogenic E coli (EPEC) NOT DETECTED NOT DETECTED Final   Enterotoxigenic E coli (ETEC) NOT DETECTED NOT DETECTED Final  Shiga like toxin producing E coli  (STEC) NOT DETECTED NOT DETECTED Final   Shigella/Enteroinvasive E coli (EIEC) NOT DETECTED NOT DETECTED Final   Cryptosporidium NOT DETECTED NOT DETECTED Final   Cyclospora cayetanensis NOT DETECTED NOT DETECTED Final   Entamoeba histolytica NOT DETECTED NOT DETECTED Final   Giardia lamblia NOT DETECTED NOT DETECTED Final   Adenovirus F40/41 NOT DETECTED NOT DETECTED Final   Astrovirus NOT DETECTED NOT DETECTED Final   Norovirus GI/GII NOT DETECTED NOT DETECTED Final   Rotavirus A NOT DETECTED NOT DETECTED Final   Sapovirus (I, II, IV, and V) NOT DETECTED NOT DETECTED Final  MRSA PCR Screening     Status: None   Collection Time: 06/30/17  8:30 PM  Result Value Ref Range Status   MRSA by PCR NEGATIVE NEGATIVE Final    Comment:        The GeneXpert MRSA Assay (FDA approved for NASAL specimens only), is one component of a comprehensive MRSA colonization surveillance program. It is not intended to diagnose MRSA infection nor to guide or monitor treatment for MRSA infections.     RADIOLOGY:  Dg Chest Port 1 View  Result Date: 07/02/2017 CLINICAL DATA:  Shortness of breath. EXAM: PORTABLE CHEST 1 VIEW COMPARISON:  06/30/2017 and 12/08/2015 FINDINGS: Stable small calcified granuloma in the lateral right chest. Heart size is within normal limits. Atherosclerotic calcifications at the aortic arch. No focal airspace disease or pulmonary edema. Trachea is midline. Negative for a pneumothorax. No acute bone abnormality. IMPRESSION: No active disease. Electronically Signed   By: Markus Daft M.D.   On: 07/02/2017 09:24   Dg Chest Portable 1 View  Result Date: 06/30/2017 CLINICAL DATA:  Smoker, HTN status post seizure. EXAM: PORTABLE CHEST 1 VIEW COMPARISON:  Chest x-ray dated 04/21/2017. FINDINGS: Heart size and mediastinal contours are stable. Atherosclerotic changes noted at the aortic arch. Prominent interstitial lung markings again noted bilaterally, most prominent within the right  infrahilar lung, suggesting chronic interstitial lung disease, stable. No new lung findings. No pleural effusion or pneumothorax seen. No acute or suspicious osseous finding. IMPRESSION: No active disease.  No evidence of pneumonia or pulmonary edema. Aortic atherosclerosis. 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 Electronically Signed   By: Franki Cabot M.D.   On: 06/30/2017 14:44      Management plans discussed with the patient, family and they are in agreement.  CODE STATUS:     Code Status Orders        Start     Ordered   06/30/17 2018  Do not attempt resuscitation (DNR)  Continuous    Question Answer Comment  In the event of cardiac or respiratory ARREST Do not call a "code blue"   In the event of cardiac or respiratory ARREST Do not perform Intubation, CPR, defibrillation or ACLS   In the event of cardiac or respiratory ARREST Use medication by any route, position, wound care, and other measures to relive pain and suffering. May use oxygen, suction and manual treatment of airway obstruction as needed for comfort.      06/30/17 2018    Code Status History    Date Active Date Inactive Code Status Order ID Comments User Context   04/21/2017 10:20 PM 04/25/2017  7:30 PM Full Code [id number]  Lance Coon, MD Inpatient   03/11/2017  5:34 PM 03/13/2017  8:12 PM Full Code [id number]  Henreitta Leber, MD ED   12/08/2015  6:53 PM 12/12/2015  4:46 PM Full Code [id number]  Jules Husbands, MD ED    Advance Directive Documentation     Most Recent Value  Type of Advance Directive  Out of facility DNR (pink MOST or yellow form), Healthcare Power of Attorney  Pre-existing out of facility DNR order (yellow form or pink MOST form)  -  "MOST" Form in Place?  -      TOTAL TIME TAKING CARE OF THIS PATIENT: 40 minutes.    Henreitta Leber M.D on 07/02/2017 at 11:03 AM  Between 7am to 6pm - Pager - [phone]  After 6pm go to [email or web address] - [facility]  Office  [phone]  CC: Primary care physician; [facility]

## 2017-07-02 NOTE — Clinical Social Work Note (Signed)
Patient will discharge to [facility] via non-emergent EMS. CSW attempted to contact family with no success. CSW supervisor interceded with the facility through the administrator, [place] has faxed all documentation to the facility. CSW will continue to follow pending additional discharge needs.  [name], MSW, Latanya Presser [phone]

## 2017-07-02 NOTE — Clinical Social Work Note (Signed)
Clinical Social Work Assessment  Patient Details  Name: Elijah Evans MRN: [id number] Date of Birth: 06-Aug-1946  Date of referral:  07/02/17               Reason for consult:  Facility Placement                Permission sought to share information with:    Permission granted to share information::  Yes, Verbal Permission Granted  Name::        Agency::  [facility]  Relationship::     Contact Information:  [phone]  Housing/Transportation Living arrangements for the past 2 months:  [place] of Information:  Patient, Facility, Medical Team Patient Interpreter Needed:  None Criminal Activity/Legal Involvement Pertinent to Current Situation/Hospitalization:  No - Comment as needed Significant Relationships:  Adult Children, Community Support Lives with:  Facility Resident Do you feel safe going back to the place where you live?  Yes Need for family participation in patient care:  No (Coment)  Care giving concerns: Patient admitted from [place] Worker assessment / plan:  CSW met with the patient at bedside to discuss discharge planning. The CSW conducted an A&O test; the client was alert and oriented to person, place, time, and situation. The patient confirmed that he is here from [facility] [employer] and that his wish is to return.   The CSW attempted to contact the patient's daughter with no success and no way to leave a HIPPA compliant voicemail. The CSW contacted the facility to confirm LTC v ALF. The facility representative confirmed that the patient is in the SNF side of the facility for LTC and can return when stable. CSW will follow to facilitate discharge. Patient will need EMS.  Employment status:  Retired Forensic scientist:  Medicare PT Recommendations:  Not assessed at this time Information / Referral to community resources:     Patient/Family's Response to care:  The patient was pleasant and thanked the CSW.  Patient/Family's  Understanding of and Emotional Response to Diagnosis, Current Treatment, and Prognosis:  The patient understands that he will return to LTC when stable and is in agreement.  Emotional Assessment Appearance:  Appears stated age Attitude/Demeanor/Rapport:  Lethargic (Cooperative) Affect (typically observed):  Appropriate, Pleasant Orientation:  Oriented to Self, Oriented to Place, Oriented to Situation, Oriented to  Time Alcohol / Substance use:  Never Used Psych involvement (Current and /or in the community):  No (Comment)  Discharge Needs  Concerns to be addressed:  Care Coordination, Discharge Planning Concerns Readmission within the last 30 days:  Yes Current discharge risk:  Chronically ill Barriers to Discharge:  Continued Medical Work up   [employer], LCSW 07/02/2017, 10:21 AM

## 2017-07-02 NOTE — Progress Notes (Addendum)
Patient ready for discharge. Called report to [facility]. Spoke with [place] pertinent information and answered all questions. AVS sheet printed and in patient discharge folder. EMS called for transport.

## 2017-07-02 NOTE — Progress Notes (Signed)
Pt remaining alert this shift. Iv infusing without difficulty. No loose stools this shift. Remaining on room air. Pt able to sleep in between care.

## 2017-07-02 NOTE — NC FL2 (Signed)
[place] LEVEL OF CARE SCREENING TOOL     IDENTIFICATION  Patient Name: Elijah Evans Birthdate: [birth year] Sex: male Admission Date (Current Location): 06/30/2017  [place] and [place] Number:  Engineering geologist and Address:  [facility], [address], [place], [place] [address]      Provider Number: [id number]  Attending Physician Name and Address:  Henreitta Leber, MD  Relative Name and Phone Number:  Amadeus Oyama (Daughter) [phone]    Current Level of Care: Hospital Recommended Level of Care: [place] Prior Approval Number:    Date Approved/Denied: 04/25/17 PASRR Number: [id number]  Discharge Plan: SNF    Current Diagnoses: Patient Active Problem List   Diagnosis Date Noted  . Hypovolemic shock ([place]) 06/30/2017  . Abnormal CT of the abdomen 04/28/2017  . Abdominal pain 04/21/2017  . Seizures ([place]) 04/21/2017  . Hypertension 04/21/2017  . Sepsis ([place]) 04/21/2017  . Dilantin toxicity 03/11/2017  . SBO (small bowel obstruction) ([place])   . Small bowel obstruction ([place]) 12/08/2015  . Hypercholesterolemia 11/12/2014  . Seizure disorder ([place]) 11/12/2014    Orientation RESPIRATION BLADDER Height & Weight     Self, Time, Situation, Place    Incontinent Weight: 181 lb 9.6 oz (82.4 kg) Height:  5\' 9"  (175.3 cm)  BEHAVIORAL SYMPTOMS/MOOD NEUROLOGICAL BOWEL NUTRITION STATUS    Convulsions/Seizures Incontinent Diet (Heart Healthy )  AMBULATORY STATUS COMMUNICATION OF NEEDS Skin   Extensive Assist Verbally Normal                       Personal Care Assistance Level of Assistance  Bathing, Feeding, Dressing Bathing Assistance: Limited assistance Feeding assistance: Independent Dressing Assistance: Limited assistance     Functional Limitations Info             SPECIAL CARE FACTORS FREQUENCY                       Contractures Contractures Info: Not present    Additional  Factors Info  Code Status, Allergies Code Status Info: DNR Allergies Info: No Known Allergies           Current Medications (07/02/2017):  This is the current hospital active medication list Current Facility-Administered Medications  Medication Dose Route Frequency Provider Last Rate Last Dose  . acetaminophen (TYLENOL) tablet 650 mg  650 mg Oral Q6H PRN Baxter Hire, MD       Or  . acetaminophen (TYLENOL) suppository 650 mg  650 mg Rectal Q6H PRN Baxter Hire, MD      . acetaminophen (TYLENOL) tablet 650 mg  650 mg Oral Q4H PRN Baxter Hire, MD      . albuterol (PROVENTIL HFA;VENTOLIN HFA) 108 (90 Base) MCG/ACT inhaler 2 puff  2 puff Inhalation Q6H PRN Baxter Hire, MD      . carbamazepine (TEGRETOL) tablet 400 mg  400 mg Oral BID Baxter Hire, MD   400 mg at 07/01/17 2122  . dipyridamole-aspirin (AGGRENOX) 200-25 MG per 12 hr capsule 1 capsule  1 capsule Oral BID Baxter Hire, MD   1 capsule at 07/01/17 2122  . fluticasone (FLONASE) 50 MCG/ACT nasal spray 1 spray  1 spray Each Nare Daily PRN Baxter Hire, MD      . heparin injection 5,000 Units  5,000 Units Subcutaneous Q8H Baxter Hire, MD   5,000 Units at 07/02/17 [phone]  . losartan (COZAAR) tablet 50 mg  50 mg Oral Daily Larene Beach, [facility]       And  . hydrochlorothiazide 10 mg/mL oral suspension 6.25 mg  6.25 mg Oral Daily Larene Beach, RPH      . lacosamide (VIMPAT) tablet 100 mg  100 mg Oral BID Baxter Hire, MD   100 mg at 07/01/17 2122  . magnesium hydroxide (MILK OF MAGNESIA) suspension 30 mL  30 mL Oral Daily PRN Baxter Hire, MD      . MEDLINE mouth rinse  15 mL Mouth Rinse BID Baxter Hire, MD   15 mL at 07/01/17 2127  . multivitamin with minerals tablet 1 tablet  1 tablet Oral Daily Baxter Hire, MD   1 tablet at 07/01/17 5701  . ondansetron (ZOFRAN) tablet 4 mg  4 mg Oral Q6H PRN Baxter Hire, MD       Or  . ondansetron [facility]) injection 4 mg  4 mg Intravenous Q6H  PRN Baxter Hire, MD      . ondansetron (ZOFRAN-ODT) disintegrating tablet 4 mg  4 mg Oral Q8H PRN Baxter Hire, MD      . pantoprazole (PROTONIX) injection 40 mg  40 mg Intravenous Laurence Spates, MD   40 mg at 07/01/17 2123  . simvastatin (ZOCOR) tablet 40 mg  40 mg Oral QHS Baxter Hire, MD   40 mg at 07/01/17 2122  . vitamin B-12 (CYANOCOBALAMIN) tablet 1,000 mcg  1,000 mcg Oral Daily Baxter Hire, MD   1,000 mcg at 07/01/17 7793     Discharge Medications: Please see discharge summary for a list of discharge medications.  Relevant Imaging Results:  Relevant Lab Results:   Additional Information SS# [id number]  Zettie Pho, LCSW

## 2017-07-05 LAB — CULTURE, BLOOD (ROUTINE X 2)
Culture: NO GROWTH
Culture: NO GROWTH
SPECIAL REQUESTS: ADEQUATE
Special Requests: ADEQUATE

## 2017-08-03 ENCOUNTER — Emergency Department
Admission: EM | Admit: 2017-08-03 | Discharge: 2017-08-03 | Disposition: A | Discharge status: Skilled Nursing Facility | Payer: Medicare Other | Attending: Emergency Medicine | Admitting: Emergency Medicine

## 2017-08-03 ENCOUNTER — Encounter: Payer: Self-pay | Admitting: Emergency Medicine

## 2017-08-03 ENCOUNTER — Emergency Department: Payer: Medicare Other

## 2017-08-03 DIAGNOSIS — F0390 Unspecified dementia without behavioral disturbance: Secondary | ICD-10-CM | POA: Insufficient documentation

## 2017-08-03 DIAGNOSIS — I10 Essential (primary) hypertension: Secondary | ICD-10-CM | POA: Insufficient documentation

## 2017-08-03 DIAGNOSIS — Z79899 Other long term (current) drug therapy: Secondary | ICD-10-CM | POA: Insufficient documentation

## 2017-08-03 DIAGNOSIS — R404 Transient alteration of awareness: Secondary | ICD-10-CM | POA: Diagnosis not present

## 2017-08-03 DIAGNOSIS — Z8673 Personal history of transient ischemic attack (TIA), and cerebral infarction without residual deficits: Secondary | ICD-10-CM | POA: Insufficient documentation

## 2017-08-03 DIAGNOSIS — F17210 Nicotine dependence, cigarettes, uncomplicated: Secondary | ICD-10-CM | POA: Insufficient documentation

## 2017-08-03 DIAGNOSIS — Z0000 Encounter for general adult medical examination without abnormal findings: Secondary | ICD-10-CM | POA: Diagnosis present

## 2017-08-03 LAB — COMPREHENSIVE METABOLIC PANEL
ALT: 10 U/L — ABNORMAL LOW (ref 17–63)
ANION GAP: 7 (ref 5–15)
AST: 17 U/L (ref 15–41)
Albumin: 3.2 g/dL — ABNORMAL LOW (ref 3.5–5.0)
Alkaline Phosphatase: 97 U/L (ref 38–126)
BUN: 15 mg/dL (ref 6–20)
CHLORIDE: 100 mmol/L — AB (ref 101–111)
CO2: 28 mmol/L (ref 22–32)
CREATININE: 0.72 mg/dL (ref 0.61–1.24)
Calcium: 8.3 mg/dL — ABNORMAL LOW (ref 8.9–10.3)
Glucose, Bld: 119 mg/dL — ABNORMAL HIGH (ref 65–99)
POTASSIUM: 3.5 mmol/L (ref 3.5–5.1)
SODIUM: 135 mmol/L (ref 135–145)
Total Bilirubin: 0.2 mg/dL — ABNORMAL LOW (ref 0.3–1.2)
Total Protein: 5.7 g/dL — ABNORMAL LOW (ref 6.5–8.1)

## 2017-08-03 LAB — CBC WITH DIFFERENTIAL/PLATELET
Basophils Absolute: 0 10*3/uL (ref 0–0.1)
Basophils Relative: 1 %
EOS ABS: 0.2 10*3/uL (ref 0–0.7)
Eosinophils Relative: 3 %
HCT: 33.8 % — ABNORMAL LOW (ref 40.0–52.0)
HEMOGLOBIN: 11.5 g/dL — AB (ref 13.0–18.0)
LYMPHS ABS: 0.7 10*3/uL — AB (ref 1.0–3.6)
Lymphocytes Relative: 12 %
MCH: 31.1 pg (ref 26.0–34.0)
MCHC: 33.9 g/dL (ref 32.0–36.0)
MCV: 91.5 fL (ref 80.0–100.0)
MONOS PCT: 9 %
Monocytes Absolute: 0.5 10*3/uL (ref 0.2–1.0)
NEUTROS ABS: 4.8 10*3/uL (ref 1.4–6.5)
NEUTROS PCT: 75 %
Platelets: 240 10*3/uL (ref 150–440)
RBC: 3.69 MIL/uL — AB (ref 4.40–5.90)
RDW: 15 % — ABNORMAL HIGH (ref 11.5–14.5)
WBC: 6.3 10*3/uL (ref 3.8–10.6)

## 2017-08-03 LAB — URINALYSIS, COMPLETE (UACMP) WITH MICROSCOPIC
BILIRUBIN URINE: NEGATIVE
Glucose, UA: NEGATIVE mg/dL
Hgb urine dipstick: NEGATIVE
KETONES UR: NEGATIVE mg/dL
LEUKOCYTES UA: NEGATIVE
Nitrite: NEGATIVE
PROTEIN: NEGATIVE mg/dL
Specific Gravity, Urine: 1.024 (ref 1.005–1.030)
pH: 6 (ref 5.0–8.0)

## 2017-08-03 LAB — TROPONIN I: Troponin I: <0.03 ng/mL (ref <0.03)

## 2017-08-03 LAB — CARBAMAZEPINE LEVEL, TOTAL: CARBAMAZEPINE LVL: 8.5 ug/mL (ref 4.0–12.0)

## 2017-08-03 MED ORDER — SODIUM CHLORIDE 0.9 % IV BOLUS (SEPSIS)
500.0000 mL | Freq: Once | INTRAVENOUS | Status: AC
  Administered 2017-08-03: 500 mL via INTRAVENOUS

## 2017-08-03 NOTE — ED Notes (Signed)
ED Provider at bedside. 

## 2017-08-03 NOTE — ED Provider Notes (Addendum)
[facility] Emergency Department Provider Note  ____________________________________________   I have reviewed the triage vital signs and the nursing notes.   HISTORY  Chief Complaint No chief complaint on file.    HPI Elijah Evans is a 71 y.o. male presents today complaining of nothing. He states he feels fine. I did call the nursing home, they state that he was rigid and unresponsive for a brief period. No fall or injury. Patient has a history of seizures. He does take Vimpat and he takes Tegretol. He has not had a recent seizure. They're unclear if this was a seizure. He certainly became urgent and unresponsive briefly. At this time patient has no complaints, he has no pain he feels his normal self he states he felt lightheaded and he does not remember passing out. He did not hit his head. He has no chest pain or shortness of breath he has no complaints.has a dementia is at his baseline per nursing home. Patient states he has a  baseline slight weakness in his left lower extremity from his prior stroke and he feels that that is persistent but he has no new complaints.     Past Medical History:  Diagnosis Date  . Arthritis    osteo - pelvic area  . Dementia    early stages  . Hypertension   . Left leg weakness    S/P TIA  . Motion sickness    cars  . Seizures ([place])   . Shortness of breath dyspnea   . Stroke ([place])   . TIA (transient ischemic attack)    X3 - last one approx 2012  . Wears dentures    full upper and lower    Patient Active Problem List   Diagnosis Date Noted  . Hypovolemic shock ([place]) 06/30/2017  . Abnormal CT of the abdomen 04/28/2017  . Abdominal pain 04/21/2017  . Seizures ([place]) 04/21/2017  . Hypertension 04/21/2017  . Sepsis ([place]) 04/21/2017  . Dilantin toxicity 03/11/2017  . SBO (small bowel obstruction) ([place])   . Small bowel obstruction ([place]) 12/08/2015  . Hypercholesterolemia 11/12/2014  . Seizure disorder  ([place]) 11/12/2014    Past Surgical History:  Procedure Laterality Date  . FOOT SURGERY    . TONSILLECTOMY      Prior to Admission medications   Medication Sig Start Date End Date Taking? Authorizing Provider  acetaminophen (TYLENOL) 325 MG tablet Take 2 tablets (650 mg total) by mouth every 4 (four) hours as needed for mild pain, moderate pain or headache. 12/12/15  Yes Loflin, Lavona Mound, MD  albuterol (PROVENTIL HFA;VENTOLIN HFA) 108 (90 Base) MCG/ACT inhaler Inhale 2 puffs into the lungs every 6 (six) hours as needed for wheezing or shortness of breath. 03/09/17  Yes Merlyn Lot, MD  carbamazepine (TEGRETOL) 200 MG tablet Take 400 mg by mouth 2 (two) times daily.   Yes [provider]  dipyridamole-aspirin (AGGRENOX) 200-25 MG 12hr capsule Take 1 capsule by mouth 2 (two) times daily.   Yes [provider]  fluticasone (FLONASE) 50 MCG/ACT nasal spray Place 1 spray into both nostrils daily as needed for allergies or rhinitis.   Yes [provider]  lacosamide 100 MG TABS Take 1 tablet (100 mg total) by mouth 2 (two) times daily. 03/13/17  Yes Sudini, Alveta Heimlich, MD  losartan-hydrochlorothiazide (HYZAAR) 100-12.5 MG tablet Take 0.5 tablets by mouth daily.    Yes [provider]  magnesium hydroxide (MILK OF MAGNESIA) 400 MG/5ML suspension Take 30 mLs by  mouth daily as needed for moderate constipation.   Yes [provider]  meclizine (ANTIVERT) 32 MG tablet Take 1 tablet (32 mg total) by mouth 3 (three) times daily as needed. Patient taking differently: Take 25 mg by mouth 3 (three) times daily as needed.  03/06/17  Yes Darel Hong, MD  Multiple Vitamins-Minerals (MULTIVITAMIN WITH MINERALS) tablet Take 1 tablet by mouth daily.   Yes [provider]  ondansetron (ZOFRAN ODT) 4 MG disintegrating tablet Take 1 tablet (4 mg total) by mouth every 8 (eight) hours as needed for nausea or vomiting. 03/06/17  Yes Darel Hong, MD  simvastatin  (ZOCOR) 40 MG tablet Take 40 mg by mouth at bedtime.   Yes [provider]  vitamin B-12 (CYANOCOBALAMIN) 1000 MCG tablet Take 1,000 mcg by mouth daily.   Yes [provider]    Allergies Patient has no known allergies.  Family History  Problem Relation Age of Onset  . CAD Mother   . Stroke Mother   . CAD Father   . Stroke Father     Social History Social History  Substance Use Topics  . Smoking status: Current Every Day Smoker    Packs/day: 2.00    Years: 50.00    Types: Cigarettes  . Smokeless tobacco: Never Used     Comment: In care facility as of 12/11/15 - smokes only 4-5 cigs/day there  . Alcohol use No     Comment: Used to drink a lot but quit 5 years ago.    Review of Systems Constitutional: No fever/chills Eyes: No visual changes. ENT: No sore throat. No stiff neck no neck pain Cardiovascular: Denies chest pain. Respiratory: Denies shortness of breath. Gastrointestinal:   no vomiting.  No diarrhea.  No constipation. Genitourinary: Negative for dysuria. Musculoskeletal: Negative lower extremity swelling Skin: Negative for rash. Neurological: Negative for severe headaches, focal weakness or numbness.   ____________________________________________   PHYSICAL EXAM:  VITAL SIGNS: ED Triage Vitals [08/03/17 1410]  Enc Vitals Group     BP (!) 128/54     Pulse Rate 74     Resp 16     Temp 97.9 F (36.6 C)     Temp Source Oral     SpO2 98 %     Weight      Height      Head Circumference      Peak Flow      Pain Score      Pain Loc      Pain Edu?      Excl. in [place]?     Constitutional: Alert and orientedto name and place unsure of the date. Well appearing and in no acute distress. Eyes: Conjunctivae are normal Head: Atraumatic HEENT: No congestion/rhinnorhea. Mucous membranes are moist.  Oropharynx non-erythematous Neck:   Nontender with no meningismus, no masses, no stridor Cardiovascular: Normal rate, regular rhythm. Grossly normal  heart sounds.  Good peripheral circulation. Respiratory: Normal respiratory effort.  No retractions. Lungs CTAB. Abdominal: Soft and nontender. No distention. No guarding no rebound Back:  There is no focal tenderness or step off.  there is no midline tenderness there are no lesions noted. there is no CVA tenderness Musculoskeletal: No lower extremity tenderness, no upper extremity tenderness. No joint effusions, no DVT signs strong distal pulses no edema Neurologic: no obvious deficits noted in cranial nerves, patient has symmetric strength to his lower extremities she is diffusely weak at baseline he states and he has slightly less strength in the  left lower extremity and the right.  Skin:  Skin is warm, dry and intact. No rash noted. Psychiatric: Mood and affect are normal. Speech and behavior are normal.  ____________________________________________   LABS (all labs ordered are listed, but only abnormal results are displayed)  Labs Reviewed  COMPREHENSIVE METABOLIC PANEL - Abnormal; Notable for the following:       Result Value   Chloride 100 (*)    Glucose, Bld 119 (*)    Calcium 8.3 (*)    Total Protein 5.7 (*)    Albumin 3.2 (*)    ALT 10 (*)    Total Bilirubin 0.2 (*)    All other components within normal limits  CBC WITH DIFFERENTIAL/PLATELET - Abnormal; Notable for the following:    RBC 3.69 (*)    Hemoglobin 11.5 (*)    HCT 33.8 (*)    RDW 15.0 (*)    Lymphs Abs 0.7 (*)    All other components within normal limits  URINALYSIS, COMPLETE (UACMP) WITH MICROSCOPIC - Abnormal; Notable for the following:    Color, Urine YELLOW (*)    APPearance HAZY (*)    Bacteria, UA RARE (*)    Squamous Epithelial / LPF 0-5 (*)    All other components within normal limits  TROPONIN I  CARBAMAZEPINE LEVEL, TOTAL    Pertinent labs  results that were available during my care of the patient were reviewed by me and considered in my medical decision making (see chart for  details). ____________________________________________  EKG  I personally interpreted any EKGs ordered by me or triage sinus rhythm right bundle branch block no acute ischemic changes normal axis rate 70 ____________________________________________  RADIOLOGY  Pertinent labs & imaging results that were available during my care of the patient were reviewed by me and considered in my medical decision making (see chart for details). If possible, patient and/or family made aware of any abnormal findings. ____________________________________________    PROCEDURES  Procedure(s) performed: None  Procedures  Critical Care performed: None  ____________________________________________   INITIAL IMPRESSION / ASSESSMENT AND PLAN / ED COURSE  Pertinent labs & imaging results that were available during my care of the patient were reviewed by me and considered in my medical decision making (see chart for details).  I discussed with the patient'sdaughter, she states that he has "spells" like this before that are very consistent with his seizures. Patient's Tegretol level is therapeutic, patient is in no acute distress. No complaints, family would prefer he be discharged home as this is not an unusual kind of event for him. I don't think that is an unreasonable plan. He has no symptoms or complaints at this time and given his age he might find admission to be disconcerting.    ____________________________________________   FINAL CLINICAL IMPRESSION(S) / ED DIAGNOSES  Final diagnoses:  None      This chart was dictated using voice recognition software.  Despite best efforts to proofread,  errors can occur which can change meaning.      Schuyler Amor, MD 08/03/17 1701    Schuyler Amor, MD 08/03/17 [phone]

## 2017-08-03 NOTE — ED Notes (Signed)
Pt cleaned of urine at this time, new brief placed and urinal placed at this time for UA sample

## 2017-08-03 NOTE — ED Triage Notes (Signed)
Arrives via [facility].  EMS called out for stroke symptoms.  Hx TIA.  VS wnl.  EMS stgroke scale negative.  Patient is a resident of [facility] home.  CBG:  148.  20 ga saline lock started by EMS.

## 2017-08-03 NOTE — ED Notes (Signed)
Pt unable to sign signature due to pt cognition and inability to sign .

## 2017-08-03 NOTE — ED Notes (Signed)
Pt given urinal at this time, states unable to use it at this time. Pt refuses I&O cath , asking for more time. Will continue to monitor for UA

## 2017-08-03 NOTE — ED Notes (Signed)
Pt transporting back to [facility] via EMS. Pt in NAD at time of d/c, VS stable. This RN has tried to contact [facility] with no answer. Report given to EMS

## 2017-08-03 NOTE — ED Notes (Signed)
Patient transported to CT 

## 2017-08-03 NOTE — ED Notes (Addendum)
ED provider on phone with facility at this time, per nurse pt had "unresonsive like" episode this am

## 2017-08-03 NOTE — Discharge Instructions (Signed)
Return to the emergency room for any new or worrisome symptoms follow closely with primary care

## 2017-12-11 IMAGING — CT CT HEAD W/O CM
3 series · 16 of 47 positions shown, 19 images · non-contrast
Comparison: 03/11/2017 CT and prior studies

CLINICAL DATA: 71-year-old male with acute altered level of
consciousness.

EXAM:
CT HEAD WITHOUT CONTRAST
TECHNIQUE: Contiguous axial images were obtained from the base of the skull
through the vertex without intravenous contrast.

[Series 2: head wo · axial · 0.46mm/px · z∈[-116,+14]mm · 10 of 32 slices shown, 13 images]
[im 3/32  brain]
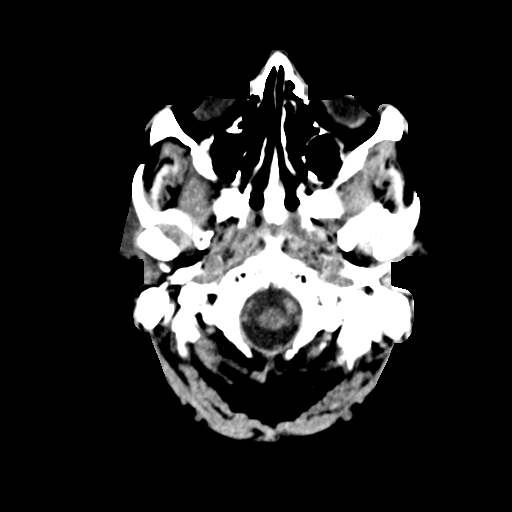
[im 3/32  bone]
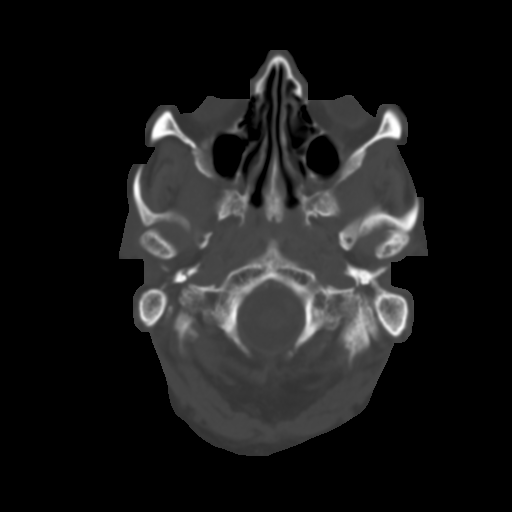
[im 6/32  brain]
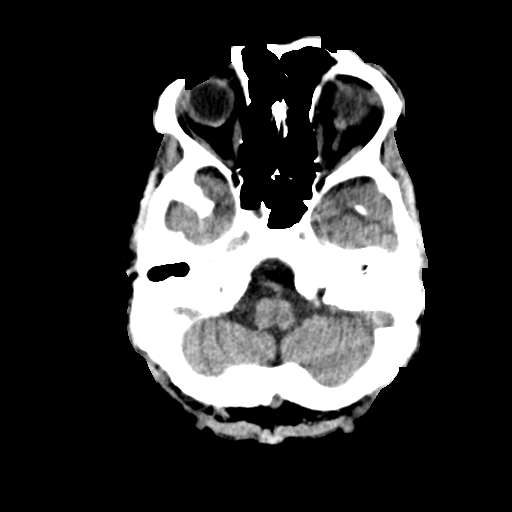
[im 9/32  brain]
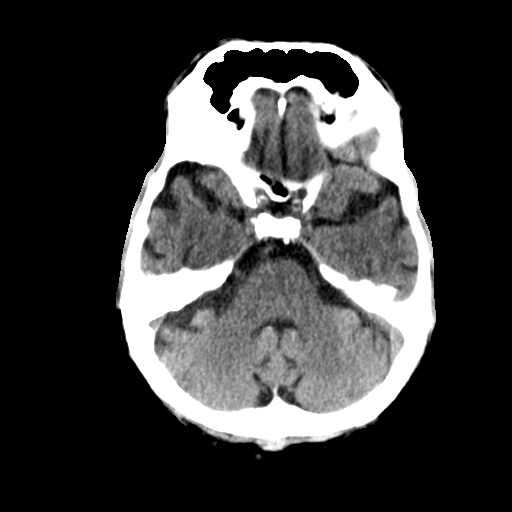
[im 11/32  brain]
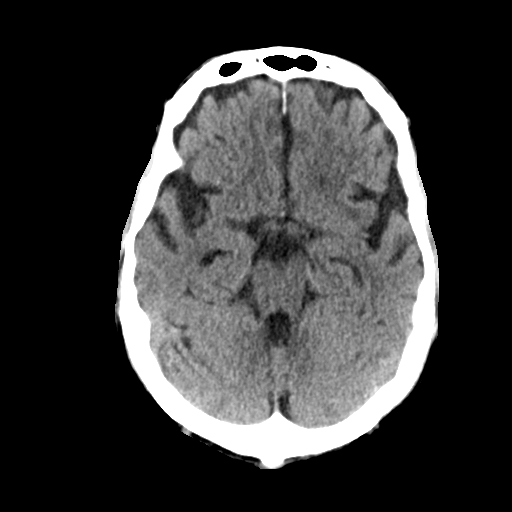
[im 14/32  brain]
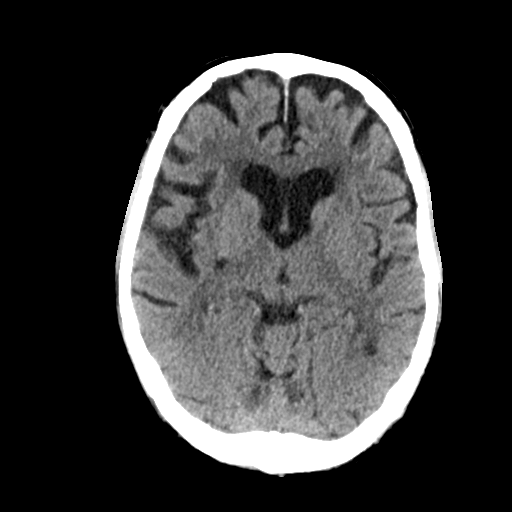
[im 14/32  bone]
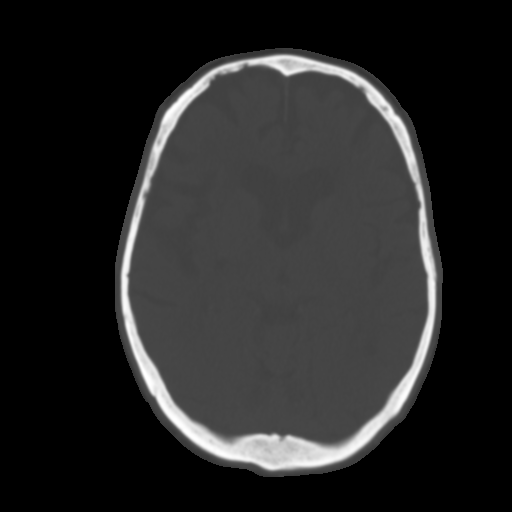
[im 18/32  brain]
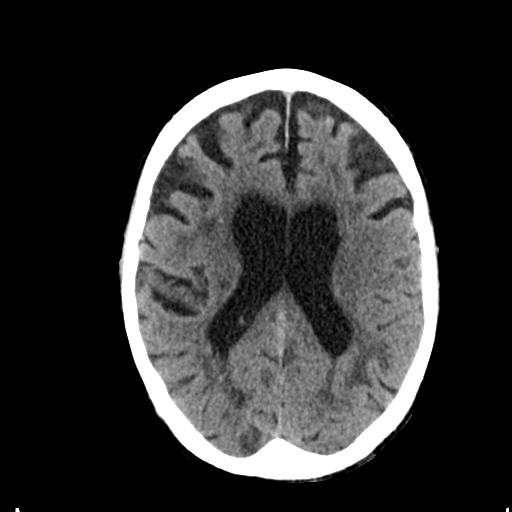
[im 21/32  brain]
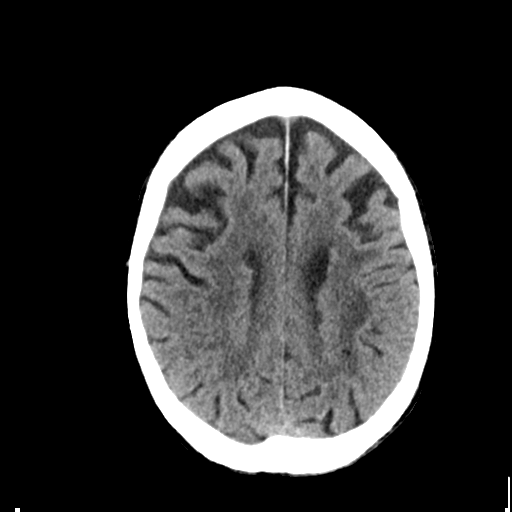
[im 24/32  brain]
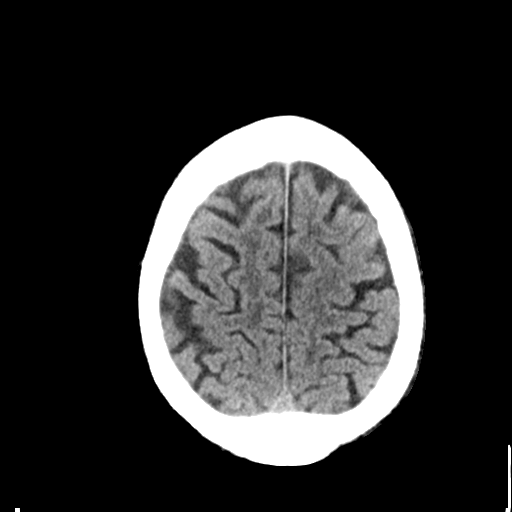
[im 26/32  brain]
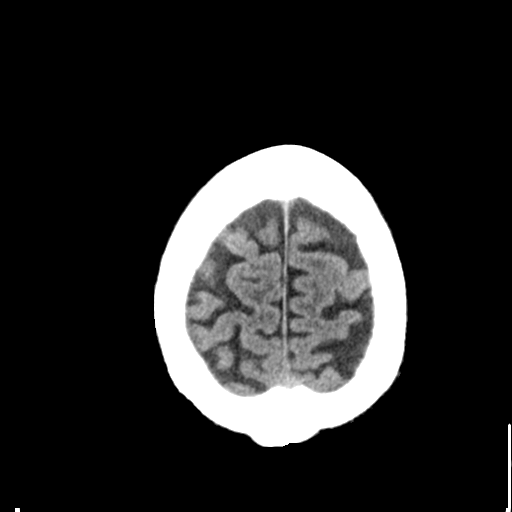
[im 26/32  bone]
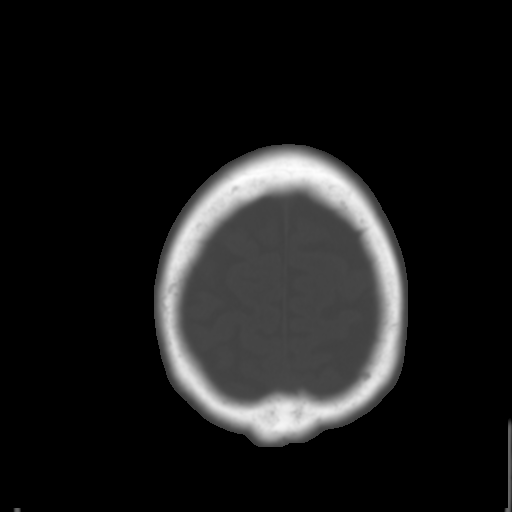
[im 29/32  brain]
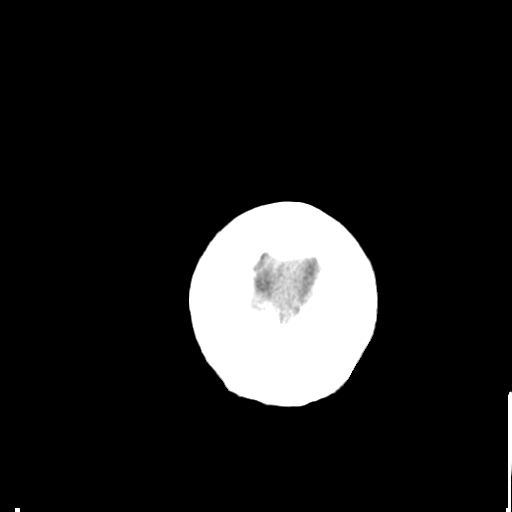

[Series 4: coronal soft tissue · coronal · 0.32mm/px · 3 of 71 slices shown]
[im 24/71  brain]
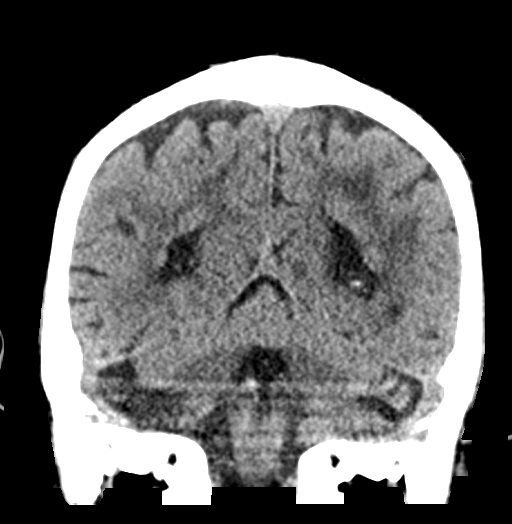
[im 32/71  brain]
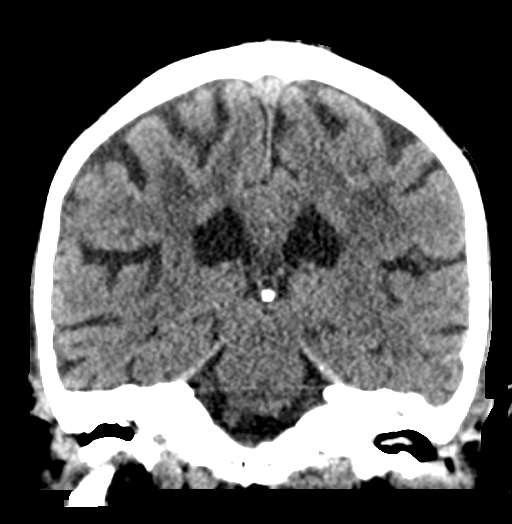
[im 39/71  brain]
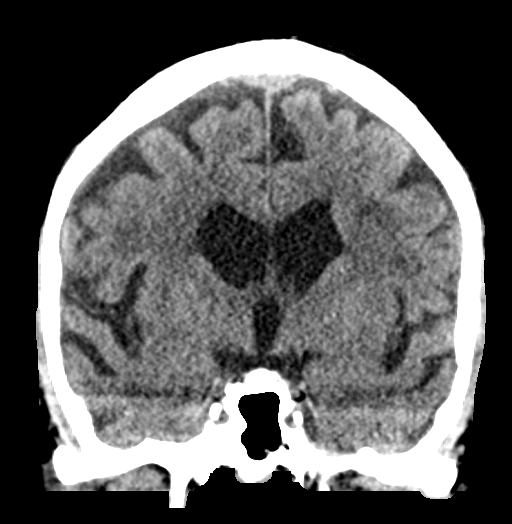

[Series 5: sagittal soft tissue · sagittal · 0.37mm/px · 3 of 59 slices shown]
[im 20/59  brain]
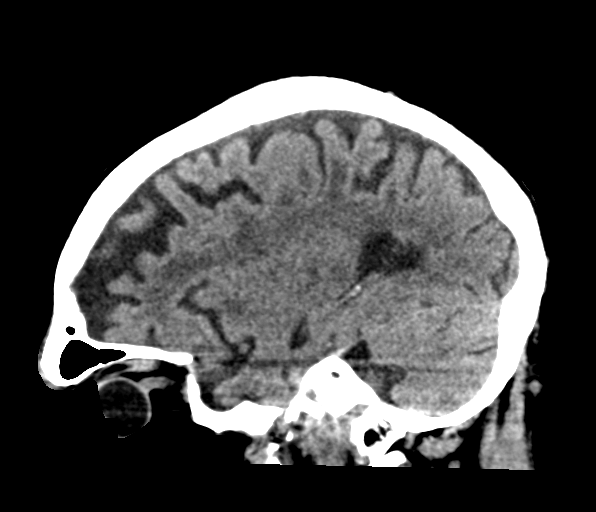
[im 30/59  brain]
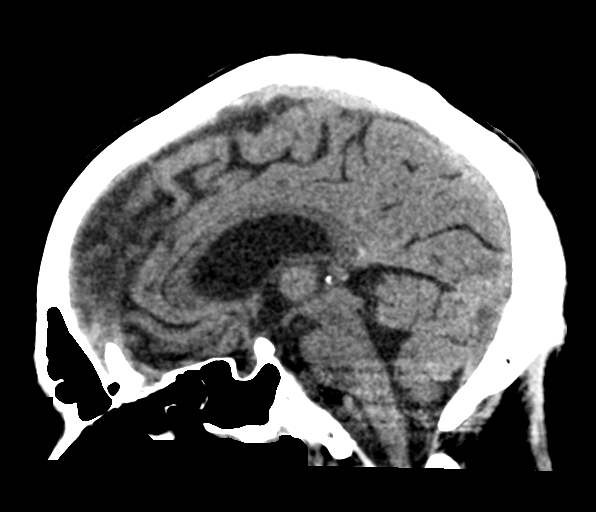
[im 39/59  brain]
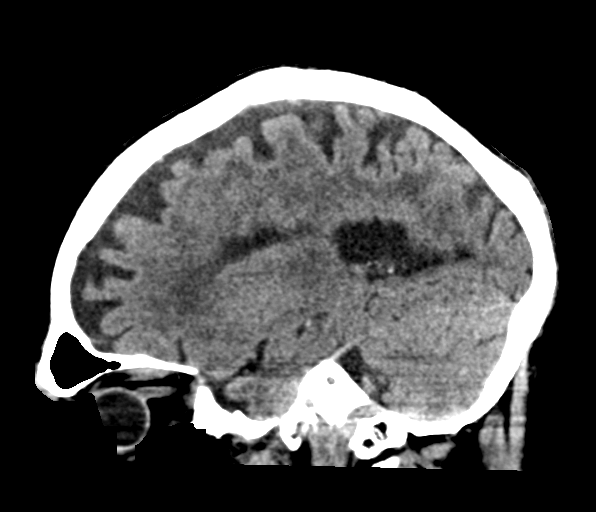

[16 of 47 positions shown; findings below may reference images not displayed]

FINDINGS: Brain: No evidence of acute infarction, hemorrhage, hydrocephalus,
extra-axial collection or mass lesion/mass effect.

Atrophy, chronic small-vessel white matter ischemic changes and
remote basal ganglia infarcts again noted.

Vascular: Intracranial atherosclerotic calcifications noted.

Skull: Normal. Negative for fracture or focal lesion.

Sinuses/Orbits: No acute abnormality

Other: None
IMPRESSION: 1. No evidence of acute intracranial abnormality.
2. Atrophy, chronic small-vessel white matter ischemic changes and
remote basal ganglia infarcts.

## 2021-11-07 DEATH — deceased
